# Patient Record
Sex: Male | Born: 1995 | Hispanic: No | Marital: Single | Smoking: Never smoker
Health system: Southern US, Community
[De-identification: ages and names within clinical notes are randomized; demographics above are authoritative.]

## PROBLEM LIST (undated history)

## (undated) DIAGNOSIS — R1110 Vomiting, unspecified: Secondary | ICD-10-CM

## (undated) DIAGNOSIS — J45909 Unspecified asthma, uncomplicated: Secondary | ICD-10-CM

## (undated) DIAGNOSIS — F909 Attention-deficit hyperactivity disorder, unspecified type: Secondary | ICD-10-CM

## (undated) DIAGNOSIS — F32A Depression, unspecified: Secondary | ICD-10-CM

## (undated) HISTORY — DX: Vomiting, unspecified: R11.10

## (undated) HISTORY — PX: ORTHOPEDIC SURGERY: SHX850

## (undated) HISTORY — PX: NO PAST SURGERIES: SHX2092

---
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
## 2017-01-27 ENCOUNTER — Emergency Department
Admission: EM | Admit: 2017-01-27 | Discharge: 2017-01-27 | Disposition: A | Discharge status: Home / Self Care | Payer: Medicaid Other | Attending: Emergency Medicine | Admitting: Emergency Medicine

## 2017-01-27 ENCOUNTER — Encounter: Payer: Self-pay | Admitting: Emergency Medicine

## 2017-01-27 DIAGNOSIS — R252 Cramp and spasm: Secondary | ICD-10-CM | POA: Insufficient documentation

## 2017-01-27 DIAGNOSIS — J45909 Unspecified asthma, uncomplicated: Secondary | ICD-10-CM | POA: Diagnosis not present

## 2017-01-27 DIAGNOSIS — Z79899 Other long term (current) drug therapy: Secondary | ICD-10-CM | POA: Diagnosis not present

## 2017-01-27 DIAGNOSIS — R1110 Vomiting, unspecified: Secondary | ICD-10-CM | POA: Diagnosis present

## 2017-01-27 DIAGNOSIS — E860 Dehydration: Secondary | ICD-10-CM | POA: Diagnosis not present

## 2017-01-27 LAB — URINALYSIS, COMPLETE (UACMP) WITH MICROSCOPIC
BACTERIA UA: NONE SEEN
Glucose, UA: NEGATIVE mg/dL
HGB URINE DIPSTICK: NEGATIVE
Ketones, ur: 5 mg/dL — AB
LEUKOCYTES UA: NEGATIVE
Nitrite: NEGATIVE
Protein, ur: 100 mg/dL — AB
Specific Gravity, Urine: 1.032 — ABNORMAL HIGH (ref 1.005–1.030)
pH: 5 (ref 5.0–8.0)

## 2017-01-27 LAB — CBC
HCT: 48.4 % (ref 40.0–52.0)
Hemoglobin: 17.2 g/dL (ref 13.0–18.0)
MCH: 31.9 pg (ref 26.0–34.0)
MCHC: 35.6 g/dL (ref 32.0–36.0)
MCV: 89.6 fL (ref 80.0–100.0)
Platelets: 228 10*3/uL (ref 150–440)
RBC: 5.41 MIL/uL (ref 4.40–5.90)
RDW: 12.9 % (ref 11.5–14.5)
WBC: 8.5 10*3/uL (ref 3.8–10.6)

## 2017-01-27 LAB — COMPREHENSIVE METABOLIC PANEL
ALBUMIN: 5.5 g/dL — AB (ref 3.5–5.0)
ALT: 25 U/L (ref 17–63)
AST: 36 U/L (ref 15–41)
Alkaline Phosphatase: 71 U/L (ref 38–126)
Anion gap: 11 (ref 5–15)
BUN: 28 mg/dL — AB (ref 6–20)
CHLORIDE: 102 mmol/L (ref 101–111)
CO2: 24 mmol/L (ref 22–32)
Calcium: 10.3 mg/dL (ref 8.9–10.3)
Creatinine, Ser: 1.09 mg/dL (ref 0.61–1.24)
GFR calc Af Amer: >60 mL/min (ref >60)
Glucose, Bld: 92 mg/dL (ref 65–99)
POTASSIUM: 3.8 mmol/L (ref 3.5–5.1)
SODIUM: 137 mmol/L (ref 135–145)
Total Bilirubin: 2.8 mg/dL — ABNORMAL HIGH (ref 0.3–1.2)
Total Protein: 9.2 g/dL — ABNORMAL HIGH (ref 6.5–8.1)

## 2017-01-27 LAB — LIPASE, BLOOD: LIPASE: 23 U/L (ref 11–51)

## 2017-01-27 LAB — CK: Total CK: 417 U/L — ABNORMAL HIGH (ref 49–397)

## 2017-01-27 MED ORDER — ONDANSETRON HCL 4 MG/2ML IJ SOLN
4.0000 mg | Freq: Once | INTRAMUSCULAR | Status: AC
  Administered 2017-01-27: 4 mg via INTRAVENOUS
  Filled 2017-01-27: qty 2

## 2017-01-27 MED ORDER — SODIUM CHLORIDE 0.9 % IV BOLUS (SEPSIS)
1000.0000 mL | Freq: Once | INTRAVENOUS | Status: AC
Start: 2017-01-27 — End: 2017-01-27
  Administered 2017-01-27: 1000 mL via INTRAVENOUS

## 2017-01-27 NOTE — ED Provider Notes (Signed)
[facility] Emergency Department Provider Note  ____________________________________________  Time seen: Approximately 1:04 PM  I have reviewed the triage vital signs and the nursing notes.   HISTORY  Chief Complaint Emesis and body cramp   HPI Jerome Lam is a 21 y.o. male no significant past medical history who presents for evaluation of generalized body cramps. Patient works in [employer] and has been working outside every day in the 90 weather. Yesterday when he came home from work and started having cramps have been intermittent and severe over his body. Had one episode of emesis. This morning the cramps continued and the mother decided to bring him in for evaluation. Patient has no pain or cramps at this time. No vomiting the emergency room. No fever or chills. No myalgias. Patient has no other complaints including no abdominal pain, chest pain, shortness of breath, dizziness. He endorses that his urine has been very dark but has no dysuria.  Past Medical History:  Diagnosis Date  . Asthma   . Attention deficit hyperactivity disorder (ADHD)   . Vomiting    Since December    Patient Active Problem List   Diagnosis Date Noted  . GERD (gastroesophageal reflux disease) 11/08/2012  . Unspecified constipation 10/03/2012  . Abdominal pain, other specified site 10/02/2012  . Vomiting     History reviewed. No pertinent surgical history.  Prior to Admission medications   Medication Sig Start Date End Date Taking? Authorizing Provider  albuterol (PROVENTIL HFA;VENTOLIN HFA) 108 (90 BASE) MCG/ACT inhaler Inhale 2 puffs into the lungs every 6 (six) hours as needed. 30 minutes before soccer for asthma symptoms    [provider]  budesonide-formoterol (SYMBICORT) 160-4.5 MCG/ACT inhaler Inhale 2 puffs into the lungs 2 (two) times daily.    [provider]  fluticasone (CUTIVATE) 0.05 % cream Apply 1 application topically daily as  needed. For eczema    [provider]  fluticasone (FLONASE) 50 MCG/ACT nasal spray Place 1 spray into both nostrils 2 (two) times daily.    [provider]  ibuprofen (ADVIL,MOTRIN) 600 MG tablet Take 1 tablet (600 mg total) by mouth 4 (four) times daily. 10/08/16   Ivery Quale, PA-C  levocetirizine (XYZAL) 5 MG tablet Take 5 mg by mouth every morning.    [provider]  lisdexamfetamine (VYVANSE) 30 MG capsule Take 30 mg by mouth daily.    [provider]  methocarbamol (ROBAXIN) 500 MG tablet Take 1 tablet (500 mg total) by mouth 3 (three) times daily. 10/08/16   Ivery Quale, PA-C  montelukast (SINGULAIR) 10 MG tablet Take 10 mg by mouth every morning.     [provider]  naproxen sodium (ALEVE) 220 MG tablet Take 220-440 mg by mouth daily as needed (for pain).    [provider]  Olopatadine HCl (PATANASE) 0.6 % SOLN Place 1 puff into the nose daily.    [provider]    Allergies Bee venom  Family History  Problem Relation Age of Onset  . Celiac disease Neg Hx   . Ulcers Neg Hx   . Cholelithiasis Neg Hx     Social History Social History  Substance Use Topics  . Smoking status: Never Smoker  . Smokeless tobacco: Never Used  . Alcohol use No    Review of Systems  Constitutional: Negative for fever. + cramps Eyes: Negative for visual changes. ENT: Negative for sore throat. Neck: No neck pain  Cardiovascular: Negative for chest pain. Respiratory: Negative  for shortness of breath. Gastrointestinal: Negative for abdominal pain, diarrhea. + vomiting Genitourinary: Negative for dysuria. Musculoskeletal: Negative for back pain. Skin: Negative for rash. Neurological: Negative for headaches, weakness or numbness. Psych: No SI or HI  ____________________________________________   PHYSICAL EXAM:  VITAL SIGNS: ED Triage Vitals  Enc Vitals Group     BP 01/27/17 1017 (!) 148/81     Pulse Rate 01/27/17 1017  72     Resp 01/27/17 1017 20     Temp 01/27/17 1017 97.7 F (36.5 C)     Temp Source 01/27/17 1017 Oral     SpO2 01/27/17 1017 98 %     Weight 01/27/17 1017 175 lb (79.4 kg)     Height 01/27/17 1017 5\' 6"  (1.676 m)     Head Circumference --      Peak Flow --      Pain Score 01/27/17 1020 6     Pain Loc --      Pain Edu? --      Excl. in GC? --     Constitutional: Alert and oriented. Well appearing and in no apparent distress. HEENT:      Head: Normocephalic and atraumatic.         Eyes: Conjunctivae are normal. Sclera is non-icteric.       Mouth/Throat: Mucous membranes are moist.       Neck: Supple with no signs of meningismus. Cardiovascular: Regular rate and rhythm. No murmurs, gallops, or rubs. 2+ symmetrical distal pulses are present in all extremities. No JVD. Respiratory: Normal respiratory effort. Lungs are clear to auscultation bilaterally. No wheezes, crackles, or rhonchi.  Gastrointestinal: Soft, non tender, and non distended with positive bowel sounds. No rebound or guarding. Genitourinary: No CVA tenderness. Musculoskeletal: Nontender with normal range of motion in all extremities. No edema, cyanosis, or erythema of extremities. Neurologic: Normal speech and language. Face is symmetric. Moving all extremities. No gross focal neurologic deficits are appreciated. Skin: Skin is warm, dry and intact. No rash noted. Psychiatric: Mood and affect are normal. Speech and behavior are normal.  ____________________________________________   LABS (all labs ordered are listed, but only abnormal results are displayed)  Labs Reviewed  COMPREHENSIVE METABOLIC PANEL - Abnormal; Notable for the following:       Result Value   BUN 28 (*)    Total Protein 9.2 (*)    Albumin 5.5 (*)    Total Bilirubin 2.8 (*)    All other components within normal limits  URINALYSIS, COMPLETE (UACMP) WITH MICROSCOPIC - Abnormal; Notable for the following:    Color, Urine AMBER (*)    APPearance  CLOUDY (*)    Specific Gravity, Urine 1.032 (*)    Bilirubin Urine SMALL (*)    Ketones, ur 5 (*)    Protein, ur 100 (*)    Squamous Epithelial / LPF 0-5 (*)    All other components within normal limits  CK - Abnormal; Notable for the following:    Total CK 417 (*)    All other components within normal limits  LIPASE, BLOOD  CBC   ____________________________________________  EKG  none  ____________________________________________  RADIOLOGY  none  ____________________________________________   PROCEDURES  Procedure(s) performed: None Procedures Critical Care performed:  None ____________________________________________   INITIAL IMPRESSION / ASSESSMENT AND PLAN / ED COURSE  21 y.o. 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 Patient is extremely well-appearing, normal vital  signs, no distress, no myalgias, no cramping in the emergency room. Patient received a liter fluid here and reports improvement. UA with 5 ketones consistent with dehydration. Total CK mildly elevated. Normal kidney function. Patient is urinating in the emergency room. Electrolytes with no acute findings. Patient can be discharged home with increase by mouth hydration. Discussed signs and symptoms of dehydration with the patient and recommended that he follows up with his primary care doctor.     Pertinent labs & imaging results that were available during my care of the patient were reviewed by me and considered in my medical decision making (see chart for details).    ____________________________________________   FINAL CLINICAL IMPRESSION(S) / ED DIAGNOSES  Final diagnoses:  Dehydration      NEW MEDICATIONS STARTED DURING THIS VISIT:  New Prescriptions   No medications on file     Note:  This document was prepared using Dragon voice recognition software and may include  unintentional dictation errors.    Don Perking, [place], MD 01/27/17 [phone]

## 2017-01-27 NOTE — ED Notes (Signed)
Pt c/o generalized body cramps x yesterday, denies fever. Pt works outside but states drinks plenty of water. Pt states urine has been dark the last 2 times he urinated. Pt denies any swelling. Pt A&OX4

## 2017-01-27 NOTE — ED Triage Notes (Signed)
Pt c/o generalized body cramping.  Has had vomiting.  Works in [employer] all day outside.  Ambulatory to triage. VSS.  No abdominal pain or diarrhea.  No fevers.

## 2017-02-13 ENCOUNTER — Encounter (HOSPITAL_COMMUNITY): Payer: Self-pay | Admitting: *Deleted

## 2017-02-13 ENCOUNTER — Emergency Department (HOSPITAL_COMMUNITY)
Admission: EM | Admit: 2017-02-13 | Discharge: 2017-02-13 | Disposition: A | Discharge status: Home / Self Care | Payer: Medicaid Other | Attending: Emergency Medicine | Admitting: Emergency Medicine

## 2017-02-13 DIAGNOSIS — Z79899 Other long term (current) drug therapy: Secondary | ICD-10-CM | POA: Insufficient documentation

## 2017-02-13 DIAGNOSIS — Y998 Other external cause status: Secondary | ICD-10-CM | POA: Insufficient documentation

## 2017-02-13 DIAGNOSIS — Y929 Unspecified place or not applicable: Secondary | ICD-10-CM | POA: Diagnosis not present

## 2017-02-13 DIAGNOSIS — J45909 Unspecified asthma, uncomplicated: Secondary | ICD-10-CM | POA: Insufficient documentation

## 2017-02-13 DIAGNOSIS — Y93G1 Activity, food preparation and clean up: Secondary | ICD-10-CM | POA: Diagnosis not present

## 2017-02-13 DIAGNOSIS — W25XXXA Contact with sharp glass, initial encounter: Secondary | ICD-10-CM | POA: Diagnosis not present

## 2017-02-13 DIAGNOSIS — S61214A Laceration without foreign body of right ring finger without damage to nail, initial encounter: Secondary | ICD-10-CM | POA: Insufficient documentation

## 2017-02-13 MED ORDER — LIDOCAINE HCL (PF) 1 % IJ SOLN
5.0000 mL | Freq: Once | INTRAMUSCULAR | Status: DC
  Filled 2017-02-13: qty 5

## 2017-02-13 NOTE — ED Triage Notes (Signed)
Pt was washing a glass and the glass broke cutting his right middle finger. Pt's finger rewrapped in triage.

## 2017-02-13 NOTE — ED Provider Notes (Signed)
[facility]-EMERGENCY DEPT Provider Note   CSN: [id number] Arrival date & time: 02/13/17  1825     History   Chief Complaint Chief Complaint  Patient presents with  . Laceration    HPI Jerome Lam is a 21 y.o. male.  Patient is a 21 year old male who presents to the emergency department with a laceration to the ring finger of the right hand.  The patient states he was washing dishes when he cut his hand on a broken glass. He denies being on any anticoagulation medications. He has no history of any bleeding disorder. He's not had any previous operation or procedure involving the right hand. No other injury reported. The patient states he is up-to-date on his tetanus status.   The history is provided by the patient.  Laceration      Past Medical History:  Diagnosis Date  . Asthma   . Attention deficit hyperactivity disorder (ADHD)   . Vomiting    Since December    Patient Active Problem List   Diagnosis Date Noted  . GERD (gastroesophageal reflux disease) 11/08/2012  . Unspecified constipation 10/03/2012  . Abdominal pain, other specified site 10/02/2012  . Vomiting     History reviewed. No pertinent surgical history.     Home Medications    Prior to Admission medications   Medication Sig Start Date End Date Taking? Authorizing Provider  albuterol (PROVENTIL HFA;VENTOLIN HFA) 108 (90 BASE) MCG/ACT inhaler Inhale 2 puffs into the lungs every 6 (six) hours as needed. 30 minutes before soccer for asthma symptoms    [provider]  budesonide-formoterol (SYMBICORT) 160-4.5 MCG/ACT inhaler Inhale 2 puffs into the lungs 2 (two) times daily.    [provider]  fluticasone (CUTIVATE) 0.05 % cream Apply 1 application topically daily as needed. For eczema    [provider]  fluticasone (FLONASE) 50 MCG/ACT nasal spray Place 1 spray into both nostrils 2 (two) times daily.    [provider]  ibuprofen (ADVIL,MOTRIN) 600 MG tablet Take 1  tablet (600 mg total) by mouth 4 (four) times daily. 10/08/16   Ivery QualeBryant, Anaija Wissink, PA-C  levocetirizine (XYZAL) 5 MG tablet Take 5 mg by mouth every morning.    [provider]  lisdexamfetamine (VYVANSE) 30 MG capsule Take 30 mg by mouth daily.    [provider]  methocarbamol (ROBAXIN) 500 MG tablet Take 1 tablet (500 mg total) by mouth 3 (three) times daily. 10/08/16   Ivery QualeBryant, Milo Schreier, PA-C  montelukast (SINGULAIR) 10 MG tablet Take 10 mg by mouth every morning.     [provider]  naproxen sodium (ALEVE) 220 MG tablet Take 220-440 mg by mouth daily as needed (for pain).    [provider]  Olopatadine HCl (PATANASE) 0.6 % SOLN Place 1 puff into the nose daily.    [provider]    Family History Family History  Problem Relation Age of Onset  . Celiac disease Neg Hx   . Ulcers Neg Hx   . Cholelithiasis Neg Hx     Social History Social History  Substance Use Topics  . Smoking status: Never Smoker  . Smokeless tobacco: Never Used  . Alcohol use No     Allergies   Bee venom   Review of Systems Review of Systems  Constitutional: Negative for activity change.       All ROS Neg except as noted in HPI  HENT: Negative for nosebleeds.   Eyes: Negative for photophobia and discharge.  Respiratory: Negative  for cough, shortness of breath and wheezing.   Cardiovascular: Negative for chest pain and palpitations.  Gastrointestinal: Negative for abdominal pain and blood in stool.  Genitourinary: Negative for dysuria, frequency and hematuria.  Musculoskeletal: Negative for arthralgias, back pain and neck pain.  Skin: Negative.   Neurological: Negative for dizziness, seizures and speech difficulty.  Psychiatric/Behavioral: Negative for confusion and hallucinations.     Physical Exam Updated Vital Signs BP 135/72 (BP Location: Left Arm)   Pulse 74   Temp 98.2 F (36.8 C) (Oral)   Resp 16   Ht 5\' 6"  (1.676 m)   Wt 79.4 kg (175 lb)   SpO2  100%   BMI 28.25 kg/m   Physical Exam  Constitutional: He is oriented to person, place, and time. He appears well-developed and well-nourished.  Non-toxic appearance.  HENT:  Head: Normocephalic.  Right Ear: Tympanic membrane and external ear normal.  Left Ear: Tympanic membrane and external ear normal.  Eyes: Pupils are equal, round, and reactive to light. EOM and lids are normal.  Neck: Normal range of motion. Neck supple. Carotid bruit is not present.  Cardiovascular: Normal rate, regular rhythm, normal heart sounds, intact distal pulses and normal pulses.   Pulmonary/Chest: Breath sounds normal. No respiratory distress.  Abdominal: Soft. Bowel sounds are normal. There is no tenderness. There is no guarding.  Musculoskeletal: Normal range of motion.       Right hand: He exhibits laceration.       Hands: Patient has a 3.2 cm laceration of the right ring finger. There is no tendon involvement. The patient has full range of motion with good flexion and good extension.   Lymphadenopathy:       Head (right side): No submandibular adenopathy present.       Head (left side): No submandibular adenopathy present.    He has no cervical adenopathy.  Neurological: He is alert and oriented to person, place, and time. He has normal strength. No cranial nerve deficit or sensory deficit.  Skin: Skin is warm and dry.  Psychiatric: He has a normal mood and affect. His speech is normal.  Nursing note and vitals reviewed.    ED Treatments / Results  Labs (all labs ordered are listed, but only abnormal results are displayed) Labs Reviewed - No data to display  EKG  EKG Interpretation None       Radiology No results found.  Procedures .Marland KitchenLaceration Repair Date/Time: 02/13/2017 8:59 PM Performed by: Ivery Quale Authorized by: Ivery Quale   Consent:    Consent obtained:  Verbal   Consent given by:  Parent   Risks discussed:  Infection, pain, poor cosmetic result and poor wound  healing Anesthesia (see MAR for exact dosages):    Anesthesia method:  Local infiltration   Local anesthetic:  Lidocaine 1% w/o epi Laceration details:    Location:  Finger   Finger location:  R ring finger   Length (cm):  3.2 Repair type:    Repair type:  Simple Pre-procedure details:    Preparation:  Patient was prepped and draped in usual sterile fashion Exploration:    Hemostasis achieved with:  Direct pressure   Wound exploration: wound explored through full range of motion and entire depth of wound probed and visualized     Wound extent: no nerve damage noted and no tendon damage noted   Treatment:    Area cleansed with:  Betadine   Amount of cleaning:  Standard   Irrigation solution:  Sterile saline  Skin repair:    Repair method:  Sutures   Suture size:  4-0   Suture material:  Nylon   Suture technique:  Simple interrupted   Number of sutures:  8 Approximation:    Approximation:  Close Post-procedure details:    Dressing:  Sterile dressing and splint for protection   Patient tolerance of procedure:  Tolerated well, no immediate complications   (including critical care time)  Medications Ordered in ED Medications  lidocaine (PF) (XYLOCAINE) 1 % injection 5 mL (5 mLs Other Handoff 02/13/17 2012)     Initial Impression / Assessment and Plan / ED Course  I have reviewed the triage vital signs and the nursing notes.  Pertinent labs & imaging results that were available during my care of the patient were reviewed by me and considered in my medical decision making (see chart for details).       Final Clinical Impressions(s) / ED Diagnoses MDM Vital signs within normal limits. The patient states his tetanus status is up-to-date. Laceration to the ring finger of the right hand was repaired with 8 sutures of 4-0 nylon. Patient is asked to keep the wound clean and dry. I've given him a finger splint to prevent pulling the sutures loose. The patient is asked to have the  sutures removed in 7 or 8 days.    Final diagnoses:  Laceration of right ring finger without foreign body without damage to nail, initial encounter    New Prescriptions New Prescriptions   No medications on file     Ivery Quale, Cordelia Poche 02/13/17 2101    Samuel Jester, DO 02/16/17 [phone]

## 2017-02-13 NOTE — Discharge Instructions (Signed)
Please have your sutures removed in 7 or 8 days. Please use the finger splint until the sutures are removed to prevent them from being pulled out. Please keep your hand clean and dry. It is important that this wound remains dry, you may use a cloth with soap and water on it to cleanse the hand and the area around the wound. Please see Dr. Avis Epleyees or return to the emergency department if any signs of infection.

## 2017-02-21 ENCOUNTER — Encounter (HOSPITAL_COMMUNITY): Payer: Self-pay

## 2017-02-21 ENCOUNTER — Emergency Department (HOSPITAL_COMMUNITY)
Admission: EM | Admit: 2017-02-21 | Discharge: 2017-02-21 | Disposition: A | Discharge status: Home / Self Care | Payer: Medicaid Other | Attending: Emergency Medicine | Admitting: Emergency Medicine

## 2017-02-21 DIAGNOSIS — Z4802 Encounter for removal of sutures: Secondary | ICD-10-CM

## 2017-02-21 NOTE — Discharge Instructions (Signed)
Cleanse wound with soap and water daily. Please see your primary physician or return to the emergency department if any unusual redness, red streaking, or pus like drainage.

## 2017-02-21 NOTE — ED Notes (Signed)
Patient given discharge instruction, Band-Aid applied to fingers. verbalized understand. Patient ambulatory out of the department.

## 2017-02-21 NOTE — ED Notes (Signed)
Suture in place of index finer right hand, no drainage, no pain

## 2017-02-21 NOTE — ED Provider Notes (Signed)
[facility]EMERGENCY DEPT Provider Note   CSN: [id number] Arrival date & time: 02/21/17  1209     History   Chief Complaint Chief Complaint  Patient presents with  . Suture / Staple Removal    HPI Jerome Lam is a 21 y.o. male.  The history is provided by the patient.  Suture / Staple Removal  The current episode started more than 1 week ago. The problem has been gradually improving. Pertinent negatives include no chest pain, no abdominal pain, no headaches and no shortness of breath. Associated symptoms comments: No fever or chills. Nothing aggravates the symptoms. He has tried a cold compress for the symptoms. The treatment provided mild relief.    Past Medical History:  Diagnosis Date  . Asthma   . Attention deficit hyperactivity disorder (ADHD)   . Vomiting    Since December    Patient Active Problem List   Diagnosis Date Noted  . GERD (gastroesophageal reflux disease) 11/08/2012  . Unspecified constipation 10/03/2012  . Abdominal pain, other specified site 10/02/2012  . Vomiting     History reviewed. No pertinent surgical history.     Home Medications    Prior to Admission medications   Medication Sig Start Date End Date Taking? Authorizing Provider  albuterol (PROVENTIL HFA;VENTOLIN HFA) 108 (90 BASE) MCG/ACT inhaler Inhale 2 puffs into the lungs every 6 (six) hours as needed. 30 minutes before soccer for asthma symptoms    [provider]  budesonide-formoterol (SYMBICORT) 160-4.5 MCG/ACT inhaler Inhale 2 puffs into the lungs 2 (two) times daily.    [provider]  fluticasone (CUTIVATE) 0.05 % cream Apply 1 application topically daily as needed. For eczema    [provider]  fluticasone (FLONASE) 50 MCG/ACT nasal spray Place 1 spray into both nostrils 2 (two) times daily.    [provider]  ibuprofen (ADVIL,MOTRIN) 600 MG tablet Take 1 tablet (600 mg total) by mouth 4 (four) times daily. 10/08/16   Ivery Quale, PA-C    levocetirizine (XYZAL) 5 MG tablet Take 5 mg by mouth every morning.    [provider]  lisdexamfetamine (VYVANSE) 30 MG capsule Take 30 mg by mouth daily.    [provider]  methocarbamol (ROBAXIN) 500 MG tablet Take 1 tablet (500 mg total) by mouth 3 (three) times daily. 10/08/16   Ivery Quale, PA-C  montelukast (SINGULAIR) 10 MG tablet Take 10 mg by mouth every morning.     [provider]  naproxen sodium (ALEVE) 220 MG tablet Take 220-440 mg by mouth daily as needed (for pain).    [provider]  Olopatadine HCl (PATANASE) 0.6 % SOLN Place 1 puff into the nose daily.    [provider]    Family History Family History  Problem Relation Age of Onset  . Celiac disease Neg Hx   . Ulcers Neg Hx   . Cholelithiasis Neg Hx     Social History Social History  Substance Use Topics  . Smoking status: Never Smoker  . Smokeless tobacco: Never Used  . Alcohol use No     Allergies   Bee venom   Review of Systems Review of Systems  Constitutional: Negative for activity change.       All ROS Neg except as noted in HPI  HENT: Negative for nosebleeds.   Eyes: Negative for photophobia and discharge.  Respiratory: Negative for cough, shortness of breath and wheezing.   Cardiovascular: Negative for chest pain and palpitations.  Gastrointestinal: Negative for  abdominal pain and blood in stool.  Genitourinary: Negative for dysuria, frequency and hematuria.  Musculoskeletal: Negative for arthralgias, back pain and neck pain.  Skin: Negative.   Neurological: Negative for dizziness, seizures, speech difficulty and headaches.  Psychiatric/Behavioral: Negative for confusion and hallucinations.     Physical Exam Updated Vital Signs BP (!) 148/77 (BP Location: Right Arm)   Pulse 78   Temp 98 F (36.7 C) (Oral)   Resp 15   Ht 5\' 6"  (1.676 m)   Wt 79.4 kg (175 lb)   SpO2 99%   BMI 28.25 kg/m   Physical Exam  Constitutional: He is  oriented to person, place, and time. He appears well-developed and well-nourished.  Non-toxic appearance.  HENT:  Head: Normocephalic.  Right Ear: Tympanic membrane and external ear normal.  Left Ear: Tympanic membrane and external ear normal.  Eyes: Pupils are equal, round, and reactive to light. EOM and lids are normal.  Neck: Normal range of motion. Neck supple. Carotid bruit is not present.  Cardiovascular: Normal rate, regular rhythm, normal heart sounds, intact distal pulses and normal pulses.   Pulmonary/Chest: Breath sounds normal. No respiratory distress.  Abdominal: Soft. Bowel sounds are normal. There is no tenderness. There is no guarding.  Musculoskeletal: Normal range of motion.  Sutured wound to the right right index finger is progressing nicely. No swelling. No red streaks appreciated. No drainage appreciated.  Capillary refill is less than 2 seconds. There is some stiffness of the PIP joint, but there is good range of motion present. Sutures remain in place.  Lymphadenopathy:       Head (right side): No submandibular adenopathy present.       Head (left side): No submandibular adenopathy present.    He has no cervical adenopathy.  Neurological: He is alert and oriented to person, place, and time. He has normal strength. No cranial nerve deficit or sensory deficit.  Skin: Skin is warm and dry.  Psychiatric: He has a normal mood and affect. His speech is normal.  Nursing note and vitals reviewed.    ED Treatments / Results  Labs (all labs ordered are listed, but only abnormal results are displayed) Labs Reviewed - No data to display  EKG  EKG Interpretation None       Radiology No results found.  Procedures .Suture Removal Date/Time: 02/21/2017 1:32 PM Performed by: Ivery Quale Authorized by: Ivery Quale   Consent:    Consent obtained:  Verbal   Consent given by:  Patient   Risks discussed:  Bleeding, pain and wound separation Location:     Location:  Upper extremity   Upper extremity location:  Hand   Hand location:  R ring finger Procedure details:    Wound appearance:  No signs of infection, good wound healing and clean   Number of sutures removed:  8 Post-procedure details:    Post-removal:  Band-Aid applied   Patient tolerance of procedure:  Tolerated well, no immediate complications   (including critical care time)  Medications Ordered in ED Medications - No data to display   Initial Impression / Assessment and Plan / ED Course  I have reviewed the triage vital signs and the nursing notes.  Pertinent labs & imaging results that were available during my care of the patient were reviewed by me and considered in my medical decision making (see chart for details).       Final Clinical Impressions(s) / ED Diagnoses MDM Vital signs within normal limits. No evidence of  advancing infection at the sutured site. Sutures removed by me. After the sutures were removed, the wound site looks good. No evidence of infection.  Feel that it is safe for the patient to be discharged home. Patient given instructions to return if any signs of infection return. Patient is in agreement with this plan.    Final diagnoses:  Visit for suture removal    New Prescriptions New Prescriptions   No medications on file     Duayne CalBryant, Cosandra Plouffe, PA-C 02/21/17 1333    Bethann BerkshireZammit, Joseph, MD 02/22/17 1531

## 2017-02-21 NOTE — ED Triage Notes (Signed)
Here for suture removal of right 4th finger. Denies any complaints.

## 2017-11-24 ENCOUNTER — Encounter (HOSPITAL_COMMUNITY): Payer: Self-pay

## 2017-11-24 ENCOUNTER — Emergency Department (HOSPITAL_COMMUNITY)
Admission: EM | Admit: 2017-11-24 | Discharge: 2017-11-24 | Disposition: A | Discharge status: Home / Self Care | Payer: Medicaid Other | Attending: Emergency Medicine | Admitting: Emergency Medicine

## 2017-11-24 ENCOUNTER — Other Ambulatory Visit: Payer: Self-pay

## 2017-11-24 DIAGNOSIS — R112 Nausea with vomiting, unspecified: Secondary | ICD-10-CM | POA: Diagnosis present

## 2017-11-24 DIAGNOSIS — Z79899 Other long term (current) drug therapy: Secondary | ICD-10-CM | POA: Diagnosis not present

## 2017-11-24 DIAGNOSIS — J45909 Unspecified asthma, uncomplicated: Secondary | ICD-10-CM | POA: Insufficient documentation

## 2017-11-24 LAB — COMPREHENSIVE METABOLIC PANEL
ALT: 22 U/L (ref 17–63)
ANION GAP: 15 (ref 5–15)
AST: 29 U/L (ref 15–41)
Albumin: 5.3 g/dL — ABNORMAL HIGH (ref 3.5–5.0)
Alkaline Phosphatase: 70 U/L (ref 38–126)
BUN: 29 mg/dL — AB (ref 6–20)
CHLORIDE: 99 mmol/L — AB (ref 101–111)
CO2: 23 mmol/L (ref 22–32)
Calcium: 10.1 mg/dL (ref 8.9–10.3)
Creatinine, Ser: 0.97 mg/dL (ref 0.61–1.24)
GFR calc Af Amer: >60 mL/min (ref >60)
GFR calc non Af Amer: >60 mL/min (ref >60)
Glucose, Bld: 92 mg/dL (ref 65–99)
Potassium: 3.6 mmol/L (ref 3.5–5.1)
Sodium: 137 mmol/L (ref 135–145)
TOTAL PROTEIN: 9 g/dL — AB (ref 6.5–8.1)
Total Bilirubin: 2.3 mg/dL — ABNORMAL HIGH (ref 0.3–1.2)

## 2017-11-24 LAB — URINALYSIS, ROUTINE W REFLEX MICROSCOPIC
BILIRUBIN URINE: NEGATIVE
Glucose, UA: NEGATIVE mg/dL
Hgb urine dipstick: NEGATIVE
Ketones, ur: 5 mg/dL — AB
Leukocytes, UA: NEGATIVE
Nitrite: NEGATIVE
PH: 5 (ref 5.0–8.0)
Protein, ur: NEGATIVE mg/dL
Specific Gravity, Urine: 1.023 (ref 1.005–1.030)

## 2017-11-24 LAB — CBC WITH DIFFERENTIAL/PLATELET
BASOS ABS: 0 10*3/uL (ref 0.0–0.1)
BASOS PCT: 0 %
Eosinophils Absolute: 0.1 10*3/uL (ref 0.0–0.7)
Eosinophils Relative: 1 %
HEMATOCRIT: 48.8 % (ref 39.0–52.0)
Hemoglobin: 17.7 g/dL — ABNORMAL HIGH (ref 13.0–17.0)
Lymphocytes Relative: 25 %
Lymphs Abs: 2.3 10*3/uL (ref 0.7–4.0)
MCH: 32.5 pg (ref 26.0–34.0)
MCHC: 36.3 g/dL — ABNORMAL HIGH (ref 30.0–36.0)
MCV: 89.7 fL (ref 78.0–100.0)
MONO ABS: 0.8 10*3/uL (ref 0.1–1.0)
Monocytes Relative: 9 %
NEUTROS PCT: 65 %
Neutro Abs: 5.7 10*3/uL (ref 1.7–7.7)
Platelets: 225 10*3/uL (ref 150–400)
RBC: 5.44 MIL/uL (ref 4.22–5.81)
RDW: 12.2 % (ref 11.5–15.5)
WBC: 8.9 10*3/uL (ref 4.0–10.5)

## 2017-11-24 LAB — CK: Total CK: 305 U/L (ref 49–397)

## 2017-11-24 LAB — LIPASE, BLOOD: Lipase: 24 U/L (ref 11–51)

## 2017-11-24 MED ORDER — FAMOTIDINE 20 MG PO TABS: 20.0000 mg | ORAL_TABLET | Freq: Two times a day (BID) | ORAL | 0 refills | Status: DC

## 2017-11-24 MED ORDER — ONDANSETRON HCL 4 MG/2ML IJ SOLN
4.0000 mg | Freq: Once | INTRAMUSCULAR | Status: AC
  Administered 2017-11-24: 4 mg via INTRAVENOUS
  Filled 2017-11-24: qty 2

## 2017-11-24 MED ORDER — SODIUM CHLORIDE 0.9 % IV BOLUS
1000.0000 mL | Freq: Once | INTRAVENOUS | Status: AC
  Administered 2017-11-24: 1000 mL via INTRAVENOUS

## 2017-11-24 MED ORDER — ONDANSETRON 4 MG PO TBDP: 4.0000 mg | ORAL_TABLET | Freq: Three times a day (TID) | ORAL | 0 refills | Status: DC | PRN

## 2017-11-24 NOTE — ED Provider Notes (Signed)
[facility] EMERGENCY DEPARTMENT Provider Note   CSN: [id number] Arrival date & time: 11/24/17  4098     History   Chief Complaint Chief Complaint  Patient presents with  . Emesis    HPI Jerome Lam is a 22 y.o. male with occasional episodes of unexplained vomiting presenting with nausea, vomiting and associated intermittent upper abdominal cramping which started around 6 PM yesterday.  He had finished his work shift where he works in [employer] when his symptoms began.  He has been unable to maintain any p.o intake since that time.  He denies fevers or chills, diarrhea, back or flank pain.  He is currently symptom-free, his last episode of emesis occurred prior to arrival.  He reports drinking plenty of fluids yesterday while at work, although recalls that his last urine output around 11 AM yesterday.  He denies any history of kidney problems.  He has had no medications prior to arrival.  No alleviators for symptoms.  The last episode of this occurred in December.  The history is provided by the patient.    Past Medical History:  Diagnosis Date  . Asthma   . Attention deficit hyperactivity disorder (ADHD)   . Vomiting    Since December    Patient Active Problem List   Diagnosis Date Noted  . GERD (gastroesophageal reflux disease) 11/08/2012  . Unspecified constipation 10/03/2012  . Abdominal pain, other specified site 10/02/2012  . Vomiting     History reviewed. No pertinent surgical history.      Home Medications    Prior to Admission medications   Medication Sig Start Date End Date Taking? Authorizing Provider  albuterol (PROVENTIL HFA;VENTOLIN HFA) 108 (90 BASE) MCG/ACT inhaler Inhale 2 puffs into the lungs every 6 (six) hours as needed. 30 minutes before soccer for asthma symptoms    [provider]  budesonide-formoterol (SYMBICORT) 160-4.5 MCG/ACT inhaler Inhale 2 puffs into the lungs 2 (two) times daily.    [provider]  fluticasone  (CUTIVATE) 0.05 % cream Apply 1 application topically daily as needed. For eczema    [provider]  fluticasone (FLONASE) 50 MCG/ACT nasal spray Place 1 spray into both nostrils 2 (two) times daily.    [provider]  ibuprofen (ADVIL,MOTRIN) 600 MG tablet Take 1 tablet (600 mg total) by mouth 4 (four) times daily. 10/08/16   Ivery Quale, PA-C  levocetirizine (XYZAL) 5 MG tablet Take 5 mg by mouth every morning.    [provider]  lisdexamfetamine (VYVANSE) 30 MG capsule Take 30 mg by mouth daily.    [provider]  methocarbamol (ROBAXIN) 500 MG tablet Take 1 tablet (500 mg total) by mouth 3 (three) times daily. 10/08/16   Ivery Quale, PA-C  montelukast (SINGULAIR) 10 MG tablet Take 10 mg by mouth every morning.     [provider]  naproxen sodium (ALEVE) 220 MG tablet Take 220-440 mg by mouth daily as needed (for pain).    [provider]  Olopatadine HCl (PATANASE) 0.6 % SOLN Place 1 puff into the nose daily.    [provider]  ondansetron (ZOFRAN ODT) 4 MG disintegrating tablet Take 1 tablet (4 mg total) by mouth every 8 (eight) hours as needed for nausea or vomiting. 11/24/17   Ameliya Nicotra, Raynelle Fanning, PA-C  esomeprazole (NEXIUM) 40 MG capsule Take 1 capsule (40 mg total) by mouth daily before breakfast. Patient not taking: Reported on 08/04/2014 11/08/12 08/04/14  Jon Gills, MD    Family History  Family History  Problem Relation Age of Onset  . Celiac disease Neg Hx   . Ulcers Neg Hx   . Cholelithiasis Neg Hx     Social History Social History   Tobacco Use  . Smoking status: Never Smoker  . Smokeless tobacco: Never Used  Substance Use Topics  . Alcohol use: No  . Drug use: No     Allergies   Bee venom   Review of Systems Review of Systems  Constitutional: Negative for fever.  HENT: Negative for congestion and sore throat.   Eyes: Negative.   Respiratory: Negative for chest tightness and shortness of breath.     Cardiovascular: Negative for chest pain.  Gastrointestinal: Positive for abdominal pain, nausea and vomiting. Negative for abdominal distention, constipation and diarrhea.  Genitourinary: Negative.   Musculoskeletal: Negative for arthralgias, joint swelling and neck pain.  Skin: Negative.  Negative for rash and wound.  Neurological: Negative for dizziness, weakness, light-headedness, numbness and headaches.  Psychiatric/Behavioral: Negative.      Physical Exam Updated Vital Signs BP 137/82 (BP Location: Left Arm)   Pulse 94   Temp 97.7 F (36.5 C) (Oral)   Resp 18   Ht 5\' 6"  (1.676 m)   Wt 79.4 kg (175 lb)   SpO2 100%   BMI 28.25 kg/m   Physical Exam  Constitutional: He appears well-developed and well-nourished.  HENT:  Head: Normocephalic and atraumatic.  Eyes: Conjunctivae are normal.  Cardiovascular: Normal rate, regular rhythm, normal heart sounds and intact distal pulses.  Pulmonary/Chest: Effort normal and breath sounds normal. He has no wheezes.  Abdominal: Soft. Bowel sounds are normal. He exhibits no distension and no mass. There is no tenderness. There is no rebound and no guarding.  Completely benign abd on initial exam.  Musculoskeletal: Normal range of motion.  Neurological: He is alert.  Skin: Skin is warm and dry.  Psychiatric: He has a normal mood and affect.  Nursing note and vitals reviewed.    ED Treatments / Results  Labs (all labs ordered are listed, but only abnormal results are displayed) Labs Reviewed  CBC WITH DIFFERENTIAL/PLATELET - Abnormal; Notable for the following components:      Result Value   Hemoglobin 17.7 (*)    MCHC 36.3 (*)    All other components within normal limits  COMPREHENSIVE METABOLIC PANEL - Abnormal; Notable for the following components:   Chloride 99 (*)    BUN 29 (*)    Total Protein 9.0 (*)    Albumin 5.3 (*)    Total Bilirubin 2.3 (*)    All other components within normal limits  URINALYSIS, ROUTINE W REFLEX  MICROSCOPIC - Abnormal; Notable for the following components:   Ketones, ur 5 (*)    All other components within normal limits  LIPASE, BLOOD  CK    EKG None  Radiology No results found.  Procedures Procedures (including critical care time)  Medications Ordered in ED Medications  sodium chloride 0.9 % bolus 1,000 mL (1,000 mLs Intravenous New Bag/Given 11/24/17 0946)  ondansetron (ZOFRAN) injection 4 mg (4 mg Intravenous Given 11/24/17 0931)  sodium chloride 0.9 % bolus 1,000 mL (0 mLs Intravenous Stopped 11/24/17 0945)     Initial Impression / Assessment and Plan / ED Course  I have reviewed the triage vital signs and the nursing notes.  Pertinent labs & imaging results that were available during my care of the patient were reviewed by me and considered in my medical decision making (see chart  for details).     Labs reviewed with elevation of BUN, creatinine, urine spec grav ok, total CK ok, no signs of rhabdo or severe dehydration. Lft's and lipase normal, doubt biliary source of sx.  He does endorse h/o gerd but with no recent sx.  Will cover with pepcid.  He was given IV fluids here.  No n/v while here and tolerated PO intake.  Repeat exam, no abdominal tenderness. Normal abd exam. Zofran, PO as tolerated. F/u pcp prn.  Final Clinical Impressions(s) / ED Diagnoses   Final diagnoses:  Non-intractable vomiting with nausea, unspecified vomiting type    ED Discharge Orders        Ordered    ondansetron (ZOFRAN ODT) 4 MG disintegrating tablet  Every 8 hours PRN     11/24/17 1017       Burgess Amor, PA-C 11/24/17 1022    Loren Racer, MD 11/24/17 1456

## 2017-11-24 NOTE — ED Notes (Signed)
LBM 2 days ago.

## 2017-11-24 NOTE — ED Triage Notes (Signed)
Pt reports vomiting since yesterday and cramping in legs.

## 2017-11-24 NOTE — ED Notes (Signed)
Water given  

## 2018-05-24 ENCOUNTER — Encounter (HOSPITAL_COMMUNITY): Payer: Self-pay

## 2018-05-24 ENCOUNTER — Emergency Department (HOSPITAL_COMMUNITY)
Admission: EM | Admit: 2018-05-24 | Discharge: 2018-05-24 | Disposition: A | Discharge status: Home / Self Care | Payer: Medicaid Other | Attending: Emergency Medicine | Admitting: Emergency Medicine

## 2018-05-24 ENCOUNTER — Other Ambulatory Visit: Payer: Self-pay

## 2018-05-24 DIAGNOSIS — F909 Attention-deficit hyperactivity disorder, unspecified type: Secondary | ICD-10-CM | POA: Insufficient documentation

## 2018-05-24 DIAGNOSIS — J45909 Unspecified asthma, uncomplicated: Secondary | ICD-10-CM | POA: Insufficient documentation

## 2018-05-24 DIAGNOSIS — F32A Depression, unspecified: Secondary | ICD-10-CM

## 2018-05-24 DIAGNOSIS — Z79899 Other long term (current) drug therapy: Secondary | ICD-10-CM | POA: Insufficient documentation

## 2018-05-24 DIAGNOSIS — F329 Major depressive disorder, single episode, unspecified: Secondary | ICD-10-CM | POA: Insufficient documentation

## 2018-05-24 NOTE — ED Notes (Signed)
Provider in room  

## 2018-05-24 NOTE — Discharge Instructions (Signed)
Return to ED if you start to develop thoughts of wanting to hurt yourself or others, having hallucinations, chest pain, trouble breathing trouble swallowing.

## 2018-05-24 NOTE — ED Provider Notes (Signed)
[facility] EMERGENCY DEPARTMENT Provider Note   CSN: [id number] Arrival date & time: 05/24/18  1120     History   Chief Complaint Chief Complaint  Patient presents with  . Depression    HPI Jerome Lam is a 22 y.o. male with a past medical history of ADHD who presents to ED for evaluation of worsening depression over the past 2 months.  States that his girlfriend of 3 years moved away to [place] and he has been more depressed ever since.  States that "she was my whole life, I do not really know what to do without her."  Patient has been on an antidepressant which she does not know the name of for the past 3 weeks.  He is unsure if this has been working.  He is scheduled to meet with a therapist next week.  I asked the patient 3 times and he denied any suicidal or homicidal ideations, auditory visual hallucinations.  Denies any prior suicide attempts.  HPI  Past Medical History:  Diagnosis Date  . Asthma   . Attention deficit hyperactivity disorder (ADHD)   . Vomiting    Since December    Patient Active Problem List   Diagnosis Date Noted  . GERD (gastroesophageal reflux disease) 11/08/2012  . Unspecified constipation 10/03/2012  . Abdominal pain, other specified site 10/02/2012  . Vomiting     History reviewed. No pertinent surgical history.      Home Medications    Prior to Admission medications   Medication Sig Start Date End Date Taking? Authorizing Provider  albuterol (PROVENTIL HFA;VENTOLIN HFA) 108 (90 BASE) MCG/ACT inhaler Inhale 2 puffs into the lungs every 6 (six) hours as needed. 30 minutes before soccer for asthma symptoms    [provider]  budesonide-formoterol (SYMBICORT) 160-4.5 MCG/ACT inhaler Inhale 2 puffs into the lungs 2 (two) times daily.    [provider]  famotidine (PEPCID) 20 MG tablet Take 1 tablet (20 mg total) by mouth 2 (two) times daily. 11/24/17   Burgess Amor, PA-C  fluticasone (CUTIVATE) 0.05 %  cream Apply 1 application topically daily as needed. For eczema    [provider]  fluticasone (FLONASE) 50 MCG/ACT nasal spray Place 1 spray into both nostrils 2 (two) times daily.    [provider]  ibuprofen (ADVIL,MOTRIN) 600 MG tablet Take 1 tablet (600 mg total) by mouth 4 (four) times daily. 10/08/16   Ivery Quale, PA-C  levocetirizine (XYZAL) 5 MG tablet Take 5 mg by mouth every morning.    [provider]  lisdexamfetamine (VYVANSE) 30 MG capsule Take 30 mg by mouth daily.    [provider]  methocarbamol (ROBAXIN) 500 MG tablet Take 1 tablet (500 mg total) by mouth 3 (three) times daily. 10/08/16   Ivery Quale, PA-C  montelukast (SINGULAIR) 10 MG tablet Take 10 mg by mouth every morning.     [provider]  naproxen sodium (ALEVE) 220 MG tablet Take 220-440 mg by mouth daily as needed (for pain).    [provider]  Olopatadine HCl (PATANASE) 0.6 % SOLN Place 1 puff into the nose daily.    [provider]  ondansetron (ZOFRAN ODT) 4 MG disintegrating tablet Take 1 tablet (4 mg total) by mouth every 8 (eight) hours as needed for nausea or vomiting. 11/24/17   Burgess Amor, PA-C    Family History Family History  Problem Relation Age of Onset  . Celiac disease Neg Hx   . Ulcers  Neg Hx   . Cholelithiasis Neg Hx     Social History Social History   Tobacco Use  . Smoking status: Never Smoker  . Smokeless tobacco: Never Used  Substance Use Topics  . Alcohol use: No  . Drug use: Yes    Types: Marijuana     Allergies   Bee venom   Review of Systems Review of Systems  Constitutional: Negative for chills and fever.  Gastrointestinal: Negative for vomiting.  Psychiatric/Behavioral: Positive for dysphoric mood. Negative for agitation, confusion, decreased concentration and suicidal ideas.     Physical Exam Updated Vital Signs BP (!) 143/83 (BP Location: Right Arm)   Pulse 84   Temp 97.7 F (36.5 C)  (Oral)   Resp 20   Ht 5\' 6"  (1.676 m)   Wt 79.4 kg   SpO2 98%   BMI 28.25 kg/m   Physical Exam  Constitutional: He appears well-developed and well-nourished. No distress.  HENT:  Head: Normocephalic and atraumatic.  Eyes: Conjunctivae and EOM are normal. No scleral icterus.  Neck: Normal range of motion.  Pulmonary/Chest: Effort normal. No respiratory distress.  Neurological: He is alert.  Skin: No rash noted. He is not diaphoretic.  Psychiatric: He is withdrawn. He exhibits a depressed mood. He expresses no homicidal and no suicidal ideation. He expresses no suicidal plans and no homicidal plans.  Nursing note and vitals reviewed.    ED Treatments / Results  Labs (all labs ordered are listed, but only abnormal results are displayed) Labs Reviewed - No data to display  EKG None  Radiology No results found.  Procedures Procedures (including critical care time)  Medications Ordered in ED Medications - No data to display   Initial Impression / Assessment and Plan / ED Course  I have reviewed the triage vital signs and the nursing notes.  Pertinent labs & imaging results that were available during my care of the patient were reviewed by me and considered in my medical decision making (see chart for details).     22 year old male presents to ED for worsening depression.  He was actually seen by his pediatrician this morning and was sent to the ED.  He is unsure why he is here.  He has been taking his antidepressant has prescribed, denies any suicidal ideations or thoughts and is scheduled to meet with a therapist next week.  His depression has worsened with the recent moving away of his girlfriend of 3 years.  Patient adamant on denying SI, HI, AVH.  I did offer TTS and other resources for him but he declines.  States that he will continue to take his medications, see his therapist and but will return to the ED if he begins to experience any thoughts of wanting to harm  himself or others.  Patient is agreeable to this plan.  Portions of this note were generated with Scientist, clinical (histocompatibility and immunogenetics). Dictation errors may occur despite best attempts at proofreading.   Final Clinical Impressions(s) / ED Diagnoses   Final diagnoses:  Depression, unspecified depression type    ED Discharge Orders    None       Dietrich Pates, PA-C 05/24/18 1156    Mancel Bale, MD 05/24/18 1755

## 2018-05-24 NOTE — ED Triage Notes (Signed)
Pt denies feeling suicidal, states "I am just tired from all the heartache and sometimes it breaks me down". Pt reports going through a recent breakup with long term girlfriend moving to [place].

## 2020-03-02 ENCOUNTER — Emergency Department (HOSPITAL_COMMUNITY)
Admission: EM | Admit: 2020-03-02 | Discharge: 2020-03-02 | Disposition: A | Discharge status: Home / Self Care | Payer: Medicaid Other | Attending: Emergency Medicine | Admitting: Emergency Medicine

## 2020-03-02 ENCOUNTER — Other Ambulatory Visit: Payer: Self-pay

## 2020-03-02 ENCOUNTER — Encounter (HOSPITAL_COMMUNITY): Payer: Self-pay | Admitting: Emergency Medicine

## 2020-03-02 DIAGNOSIS — M79632 Pain in left forearm: Secondary | ICD-10-CM | POA: Diagnosis present

## 2020-03-02 DIAGNOSIS — F909 Attention-deficit hyperactivity disorder, unspecified type: Secondary | ICD-10-CM | POA: Insufficient documentation

## 2020-03-02 DIAGNOSIS — F1210 Cannabis abuse, uncomplicated: Secondary | ICD-10-CM | POA: Diagnosis not present

## 2020-03-02 DIAGNOSIS — Z79899 Other long term (current) drug therapy: Secondary | ICD-10-CM | POA: Diagnosis not present

## 2020-03-02 DIAGNOSIS — Z23 Encounter for immunization: Secondary | ICD-10-CM | POA: Insufficient documentation

## 2020-03-02 DIAGNOSIS — L02416 Cutaneous abscess of left lower limb: Secondary | ICD-10-CM | POA: Insufficient documentation

## 2020-03-02 DIAGNOSIS — J45909 Unspecified asthma, uncomplicated: Secondary | ICD-10-CM | POA: Diagnosis not present

## 2020-03-02 DIAGNOSIS — L03119 Cellulitis of unspecified part of limb: Secondary | ICD-10-CM

## 2020-03-02 DIAGNOSIS — L02414 Cutaneous abscess of left upper limb: Secondary | ICD-10-CM

## 2020-03-02 MED ORDER — CEPHALEXIN 500 MG PO CAPS
1000.0000 mg | ORAL_CAPSULE | Freq: Once | ORAL | Status: AC
  Administered 2020-03-02: 1000 mg via ORAL
  Filled 2020-03-02: qty 2

## 2020-03-02 MED ORDER — CEPHALEXIN 500 MG PO CAPS: 1000.0000 mg | ORAL_CAPSULE | Freq: Two times a day (BID) | ORAL | 0 refills | Status: DC

## 2020-03-02 MED ORDER — LIDOCAINE-EPINEPHRINE (PF) 2 %-1:200000 IJ SOLN
20.0000 mL | Freq: Once | INTRAMUSCULAR | Status: AC
  Administered 2020-03-02: 20 mL
  Filled 2020-03-02: qty 20

## 2020-03-02 MED ORDER — TETANUS-DIPHTH-ACELL PERTUSSIS 5-2.5-18.5 LF-MCG/0.5 IM SUSP
0.5000 mL | Freq: Once | INTRAMUSCULAR | Status: AC
  Administered 2020-03-02: 0.5 mL via INTRAMUSCULAR
  Filled 2020-03-02: qty 0.5

## 2020-03-02 NOTE — ED Triage Notes (Signed)
Patient states he was bitten by an insect on the left arm and now it appears to be getting infected.

## 2020-03-02 NOTE — Discharge Instructions (Signed)
It was our pleasure to provide your ER care today - we hope that you feel better.  Keep area very clean.   Change dressing 1-2x/day.   Take antibiotic as prescribed.   Return to ER if worse, new symptoms, fevers, spreading redness, severe pain, or other concern.

## 2020-03-02 NOTE — ED Provider Notes (Signed)
[facility] EMERGENCY DEPARTMENT Provider Note   CSN: [id number] Arrival date & time: 03/02/20  1020     History Chief Complaint  Patient presents with  . Arm Pain    Jerome Lam is a 24 y.o. male.  Patient c/o area redness, pustule, soreness to volar aspect left forearm. States symptoms acute onset a couple days ago after being bitten by 'sweat bugs'. Symptoms acute onset, moderate, persistent, w mild soreness/pain.  States he had been rubbing/scratching area, and now two pustules to forearm, with surrounding erythema. Denies other body rash or lesions. No hx mrsa/abscesses. Denies fever or chills. States otherwise does feel sick or ill. No nv. No weakness/faintness.   The history is provided by the patient.  Arm Pain Pertinent negatives include no chest pain and no shortness of breath.       Past Medical History:  Diagnosis Date  . Asthma   . Attention deficit hyperactivity disorder (ADHD)   . Vomiting    Since December    Patient Active Problem List   Diagnosis Date Noted  . GERD (gastroesophageal reflux disease) 11/08/2012  . Unspecified constipation 10/03/2012  . Abdominal pain, other specified site 10/02/2012  . Vomiting     History reviewed. No pertinent surgical history.     Family History  Problem Relation Age of Onset  . Celiac disease Neg Hx   . Ulcers Neg Hx   . Cholelithiasis Neg Hx     Social History   Tobacco Use  . Smoking status: Never Smoker  . Smokeless tobacco: Never Used  Vaping Use  . Vaping Use: Never used  Substance Use Topics  . Alcohol use: No  . Drug use: Yes    Frequency: 2.0 times per week    Types: Marijuana    Home Medications Prior to Admission medications   Medication Sig Start Date End Date Taking? Authorizing Provider  albuterol (PROVENTIL HFA;VENTOLIN HFA) 108 (90 BASE) MCG/ACT inhaler Inhale 2 puffs into the lungs every 6 (six) hours as needed. 30 minutes before soccer for asthma symptoms    [provider]  budesonide-formoterol (SYMBICORT) 160-4.5 MCG/ACT inhaler Inhale 2 puffs into the lungs 2 (two) times daily.    [provider]  famotidine (PEPCID) 20 MG tablet Take 1 tablet (20 mg total) by mouth 2 (two) times daily. 11/24/17   Burgess Amor, PA-C  fluticasone (CUTIVATE) 0.05 % cream Apply 1 application topically daily as needed. For eczema    [provider]  fluticasone (FLONASE) 50 MCG/ACT nasal spray Place 1 spray into both nostrils 2 (two) times daily.    [provider]  ibuprofen (ADVIL,MOTRIN) 600 MG tablet Take 1 tablet (600 mg total) by mouth 4 (four) times daily. 10/08/16   Ivery Quale, PA-C  levocetirizine (XYZAL) 5 MG tablet Take 5 mg by mouth every morning.    [provider]  lisdexamfetamine (VYVANSE) 30 MG capsule Take 30 mg by mouth daily.    [provider]  methocarbamol (ROBAXIN) 500 MG tablet Take 1 tablet (500 mg total) by mouth 3 (three) times daily. 10/08/16   Ivery Quale, PA-C  montelukast (SINGULAIR) 10 MG tablet Take 10 mg by mouth every morning.     [provider]  naproxen sodium (ALEVE) 220 MG tablet Take 220-440 mg by mouth daily as needed (for pain).    [provider]  Olopatadine HCl (PATANASE) 0.6 % SOLN Place 1 puff into the nose daily.    [provider]  ondansetron (ZOFRAN ODT) 4 MG disintegrating tablet Take 1 tablet (4 mg total) by mouth every 8 (eight) hours as needed for nausea or vomiting. 11/24/17   Idol, Raynelle Fanning, PA-C    Allergies    Bee venom  Review of Systems   Review of Systems  Constitutional: Negative for fever.  HENT: Negative for sore throat.   Eyes: Negative for redness.  Respiratory: Negative for shortness of breath.   Cardiovascular: Negative for chest pain.  Gastrointestinal: Negative for vomiting.  Genitourinary: Negative for flank pain.  Musculoskeletal: Negative for neck pain.  Skin: Negative for rash.  Neurological: Negative for numbness.    Hematological: Does not bruise/bleed easily.  Psychiatric/Behavioral: Negative for confusion.    Physical Exam Updated Vital Signs BP (!) 112/90 (BP Location: Right Arm)   Pulse 83   Temp 98 F (36.7 C) (Oral)   Resp 18   Ht 1.676 m (5\' 6" )   Wt 73 kg   SpO2 100%   BMI 25.99 kg/m   Physical Exam Vitals and nursing note reviewed.  Constitutional:      Appearance: Normal appearance. He is well-developed.  HENT:     Head: Atraumatic.     Nose: Nose normal.     Mouth/Throat:     Mouth: Mucous membranes are moist.  Eyes:     General: No scleral icterus.    Conjunctiva/sclera: Conjunctivae normal.  Neck:     Trachea: No tracheal deviation.  Cardiovascular:     Rate and Rhythm: Normal rate.     Pulses: Normal pulses.  Pulmonary:     Effort: Pulmonary effort is normal. No accessory muscle usage or respiratory distress.  Abdominal:     General: There is no distension.  Genitourinary:    Comments: No cva tenderness. Musculoskeletal:     Cervical back: Neck supple.     Comments: Two small pustules to volar aspect left forearm. Larger one with fluctuance, and mild induration, with surrounding erythema 4-5 cm. No crepitus. Compartments of arm soft, not tense, no significant sts. Radial pulse 2+. No LUE l/a.   Skin:    General: Skin is warm and dry.     Findings: No rash.  Neurological:     Mental Status: He is alert.     Comments: Alert, speech clear.   Psychiatric:        Mood and Affect: Mood normal.     ED Results / Procedures / Treatments   Labs (all labs ordered are listed, but only abnormal results are displayed) Labs Reviewed - No data to display  EKG None  Radiology No results found.  Procedures . Incision and Drainage  Date/Time: 03/02/2020 12:48 PM Performed by: 05/02/2020, MD Authorized by: Cathren Laine, MD   Consent:    Consent given by:  Patient Location:    Type:  Abscess   Location:  Upper extremity   Upper extremity location:  Arm    Arm location:  L lower arm Pre-procedure details:    Skin preparation:  Betadine Anesthesia (see MAR for exact dosages):    Anesthesia method:  Local infiltration   Local anesthetic:  Lidocaine 2% WITH epi Procedure type:    Complexity:  Complex Procedure details:    Incision types:  Single straight   Incision depth:  Subcutaneous   Scalpel blade:  11   Wound management:  Probed and deloculated   Drainage:  Purulent   Drainage amount: small amt.   Wound treatment:  Wound left open Post-procedure details:  Patient tolerance of procedure:  Tolerated well, no immediate complications   (including critical care time)  Medications Ordered in ED Medications  lidocaine-EPINEPHrine (XYLOCAINE W/EPI) 2 %-1:200000 (PF) injection 20 mL (20 mLs Infiltration Given 03/02/20 1221)  cephALEXin (KEFLEX) capsule 1,000 mg (1,000 mg Oral Given 03/02/20 1221)    ED Course  I have reviewed the triage vital signs and the nursing notes.  Pertinent labs & imaging results that were available during my care of the patient were reviewed by me and considered in my medical decision making (see chart for details).    MDM Rules/Calculators/A&P                          I and D of abscess. Sterile dressing applied. Tetanus unknown. Tetanus IM.   Reviewed nursing notes and prior charts for additional history.   Confirmed nkda. Keflex po.   Pt appears stable for d/c.     Final Clinical Impression(s) / ED Diagnoses Final diagnoses:  None    Rx / DC Orders ED Discharge Orders    None       Cathren Laine, MD 03/02/20 1249

## 2020-03-02 NOTE — ED Notes (Signed)
Here for evolving redness to what he reports is an insect bite to his arm

## 2020-03-06 ENCOUNTER — Emergency Department (HOSPITAL_COMMUNITY)
Admission: EM | Admit: 2020-03-06 | Discharge: 2020-03-06 | Disposition: A | Discharge status: Home / Self Care | Payer: Medicaid Other | Attending: Emergency Medicine | Admitting: Emergency Medicine

## 2020-03-06 ENCOUNTER — Encounter (HOSPITAL_COMMUNITY): Payer: Self-pay | Admitting: Emergency Medicine

## 2020-03-06 ENCOUNTER — Other Ambulatory Visit: Payer: Self-pay

## 2020-03-06 DIAGNOSIS — J45909 Unspecified asthma, uncomplicated: Secondary | ICD-10-CM | POA: Insufficient documentation

## 2020-03-06 DIAGNOSIS — L03114 Cellulitis of left upper limb: Secondary | ICD-10-CM

## 2020-03-06 DIAGNOSIS — R2232 Localized swelling, mass and lump, left upper limb: Secondary | ICD-10-CM | POA: Diagnosis present

## 2020-03-06 MED ORDER — DOXYCYCLINE HYCLATE 100 MG PO CAPS: 100.0000 mg | ORAL_CAPSULE | Freq: Two times a day (BID) | ORAL | 0 refills | Status: DC

## 2020-03-06 NOTE — Discharge Instructions (Signed)
Perform warm soaks and compresses twice daily.  Take the antibiotic as prescribed.  He should follow-up in the urgent care in 2 days for recheck of this area.  Return to the ED sooner with spreading redness, pain, weakness, numbness, tingling, fever or any other concerns

## 2020-03-06 NOTE — ED Triage Notes (Signed)
Pt reports he had an I&D on 03/02/2020 on his left forearm. Pt reports the pain and swelling are worse today. Denies fever.

## 2020-03-06 NOTE — ED Provider Notes (Signed)
[facility] EMERGENCY DEPARTMENT Provider Note   CSN: [id number] Arrival date & time: 03/06/20  0301     History Chief Complaint  Patient presents with  . Abscess    Jerome Lam is a 24 y.o. male.  Patient here with redness, pain and swelling to his left forearm for the past 1 day.  States he had some bumps from a "sweat bugs" several days ago.  He underwent a incision and drainage on August 1.  He has been taking Keflex at home which he states compliance with.  He states the wound initially improved but over the past 1 day it has been, increasingly red, swollen and sore.  He states there has been some clear drainage but no bleeding or pus.  He has pain with movement of his left arm.  It is progressively worsening.  No fever, chills, nausea or vomiting.  No focal weakness, numbness or tingling.  Denies any new injury.  Denies any injection drug abuse.  The history is provided by the patient.  Abscess Associated symptoms: no fever, no headaches, no nausea and no vomiting        Past Medical History:  Diagnosis Date  . Asthma   . Attention deficit hyperactivity disorder (ADHD)   . Vomiting    Since December    Patient Active Problem List   Diagnosis Date Noted  . GERD (gastroesophageal reflux disease) 11/08/2012  . Unspecified constipation 10/03/2012  . Abdominal pain, other specified site 10/02/2012  . Vomiting     History reviewed. No pertinent surgical history.     Family History  Problem Relation Age of Onset  . Celiac disease Neg Hx   . Ulcers Neg Hx   . Cholelithiasis Neg Hx     Social History   Tobacco Use  . Smoking status: Never Smoker  . Smokeless tobacco: Never Used  Vaping Use  . Vaping Use: Never used  Substance Use Topics  . Alcohol use: No  . Drug use: Yes    Frequency: 2.0 times per week    Types: Marijuana    Home Medications Prior to Admission medications   Medication Sig Start Date End Date Taking? Authorizing Provider  albuterol  (PROVENTIL HFA;VENTOLIN HFA) 108 (90 BASE) MCG/ACT inhaler Inhale 2 puffs into the lungs every 6 (six) hours as needed. 30 minutes before soccer for asthma symptoms    [provider]  budesonide-formoterol (SYMBICORT) 160-4.5 MCG/ACT inhaler Inhale 2 puffs into the lungs 2 (two) times daily.    [provider]  cephALEXin (KEFLEX) 500 MG capsule Take 2 capsules (1,000 mg total) by mouth 2 (two) times daily. 03/02/20   Cathren Laine, MD  famotidine (PEPCID) 20 MG tablet Take 1 tablet (20 mg total) by mouth 2 (two) times daily. 11/24/17   Burgess Amor, PA-C  fluticasone (CUTIVATE) 0.05 % cream Apply 1 application topically daily as needed. For eczema    [provider]  fluticasone (FLONASE) 50 MCG/ACT nasal spray Place 1 spray into both nostrils 2 (two) times daily.    [provider]  ibuprofen (ADVIL,MOTRIN) 600 MG tablet Take 1 tablet (600 mg total) by mouth 4 (four) times daily. 10/08/16   Ivery Quale, PA-C  levocetirizine (XYZAL) 5 MG tablet Take 5 mg by mouth every morning.    [provider]  lisdexamfetamine (VYVANSE) 30 MG capsule Take 30 mg by mouth daily.    [provider]  methocarbamol (ROBAXIN) 500 MG tablet Take 1 tablet (500 mg total)  by mouth 3 (three) times daily. 10/08/16   Ivery Quale, PA-C  montelukast (SINGULAIR) 10 MG tablet Take 10 mg by mouth every morning.     [provider]  naproxen sodium (ALEVE) 220 MG tablet Take 220-440 mg by mouth daily as needed (for pain).    [provider]  Olopatadine HCl (PATANASE) 0.6 % SOLN Place 1 puff into the nose daily.    [provider]  ondansetron (ZOFRAN ODT) 4 MG disintegrating tablet Take 1 tablet (4 mg total) by mouth every 8 (eight) hours as needed for nausea or vomiting. 11/24/17   Idol, Raynelle Fanning, PA-C    Allergies    Bee venom  Review of Systems   Review of Systems  Constitutional: Negative for activity change, appetite change and fever.  HENT:  Negative for congestion and rhinorrhea.   Respiratory: Negative for cough and shortness of breath.   Cardiovascular: Negative for chest pain.  Gastrointestinal: Negative for abdominal pain, nausea and vomiting.  Genitourinary: Negative for dysuria and hematuria.  Musculoskeletal: Positive for arthralgias, joint swelling and myalgias.  Skin: Positive for wound.  Neurological: Negative for dizziness, weakness and headaches.    all other systems are negative except as noted in the HPI and PMH.   Physical Exam Updated Vital Signs BP 118/67 (BP Location: Right Arm)   Pulse 73   Temp 98.1 F (36.7 C) (Oral)   Resp 15   Ht 5\' 6"  (1.676 m)   Wt 73 kg   SpO2 98%   BMI 25.98 kg/m   Physical Exam Vitals and nursing note reviewed.  Constitutional:      General: He is not in acute distress.    Appearance: He is well-developed.  HENT:     Head: Normocephalic and atraumatic.     Mouth/Throat:     Pharynx: No oropharyngeal exudate.  Eyes:     Conjunctiva/sclera: Conjunctivae normal.     Pupils: Pupils are equal, round, and reactive to light.  Neck:     Comments: No meningismus. Cardiovascular:     Rate and Rhythm: Normal rate and regular rhythm.     Heart sounds: Normal heart sounds. No murmur heard.   Pulmonary:     Effort: Pulmonary effort is normal. No respiratory distress.     Breath sounds: Normal breath sounds.  Abdominal:     Palpations: Abdomen is soft.     Tenderness: There is no abdominal tenderness. There is no guarding or rebound.  Musculoskeletal:        General: Swelling and tenderness present. Normal range of motion.     Cervical back: Normal range of motion and neck supple.     Comments: Erythema and induration to left forearm approximately 5 x 8 cm.  No fluctuance.  Central punctum of previous incision and drainage site has some clear drainage.  No purulence.  Full range of motion of wrist and elbow.  Intact radial pulse.  Compartments are soft  Skin:     General: Skin is warm.  Neurological:     Mental Status: He is alert and oriented to person, place, and time.     Cranial Nerves: No cranial nerve deficit.     Motor: No abnormal muscle tone.     Coordination: Coordination normal.     Comments: No ataxia on finger to nose bilaterally. No pronator drift. 5/5 strength throughout. CN 2-12 intact.Equal grip strength. Sensation intact.   Psychiatric:        Behavior: Behavior normal.  ED Results / Procedures / Treatments   Labs (all labs ordered are listed, but only abnormal results are displayed) Labs Reviewed - No data to display  EKG None  Radiology No results found.  Procedures Ultrasound ED Soft Tissue  Date/Time: 03/06/2020 4:27 AM Performed by: Glynn Octave, MD Authorized by: Glynn Octave, MD   Procedure details:    Indications: localization of abscess and evaluate for cellulitis     Transverse view:  Visualized   Longitudinal view:  Visualized   Images: archived   Location:    Location: upper extremity     Side:  Left Findings:     no abscess present    cellulitis present    no foreign body present   (including critical care time)  Medications Ordered in ED Medications - No data to display  ED Course  I have reviewed the triage vital signs and the nursing notes.  Pertinent labs & imaging results that were available during my care of the patient were reviewed by me and considered in my medical decision making (see chart for details).    MDM Rules/Calculators/A&P                          Worsening redness, pain and erythema after incision and drainage 4 days ago.  Compliant with Keflex.  No fever.  No evidence of septic joint.  Bedside ultrasound does not show any drainable fluid collection at this time.  Discussed warm compresses, soaks, escalate antibiotics for MRSA coverage.  Advised wound check in urgent care in 2 days.  Advised this may need further drainage but there is nothing to  drain at this point.  Return to the ED sooner with worsening pain, spreading redness, fever, difficulty moving arm or any concerns peer Final Clinical Impression(s) / ED Diagnoses Final diagnoses:  Cellulitis of arm, left    Rx / DC Orders ED Discharge Orders    None       Tylee Newby, Jeannett Senior, MD 03/06/20 [phone]

## 2020-07-11 DIAGNOSIS — Z0001 Encounter for general adult medical examination with abnormal findings: Secondary | ICD-10-CM | POA: Diagnosis not present

## 2020-07-11 DIAGNOSIS — J4540 Moderate persistent asthma, uncomplicated: Secondary | ICD-10-CM | POA: Diagnosis not present

## 2020-07-11 DIAGNOSIS — Z113 Encounter for screening for infections with a predominantly sexual mode of transmission: Secondary | ICD-10-CM | POA: Diagnosis not present

## 2020-09-15 ENCOUNTER — Emergency Department (HOSPITAL_COMMUNITY)
Admission: EM | Admit: 2020-09-15 | Discharge: 2020-09-16 | Disposition: A | Discharge status: Home / Self Care | Payer: Medicaid Other | Attending: Emergency Medicine | Admitting: Emergency Medicine

## 2020-09-15 ENCOUNTER — Other Ambulatory Visit: Payer: Self-pay

## 2020-09-15 ENCOUNTER — Encounter (HOSPITAL_COMMUNITY): Payer: Self-pay | Admitting: Emergency Medicine

## 2020-09-15 ENCOUNTER — Emergency Department (HOSPITAL_COMMUNITY): Payer: Medicaid Other

## 2020-09-15 DIAGNOSIS — J45909 Unspecified asthma, uncomplicated: Secondary | ICD-10-CM | POA: Insufficient documentation

## 2020-09-15 DIAGNOSIS — W228XXA Striking against or struck by other objects, initial encounter: Secondary | ICD-10-CM | POA: Insufficient documentation

## 2020-09-15 DIAGNOSIS — Y990 Civilian activity done for income or pay: Secondary | ICD-10-CM | POA: Insufficient documentation

## 2020-09-15 DIAGNOSIS — S62630B Displaced fracture of distal phalanx of right index finger, initial encounter for open fracture: Secondary | ICD-10-CM | POA: Insufficient documentation

## 2020-09-15 DIAGNOSIS — Z7952 Long term (current) use of systemic steroids: Secondary | ICD-10-CM | POA: Insufficient documentation

## 2020-09-15 DIAGNOSIS — S6991XA Unspecified injury of right wrist, hand and finger(s), initial encounter: Secondary | ICD-10-CM | POA: Diagnosis present

## 2020-09-15 NOTE — ED Notes (Signed)
No answer for triage x1 

## 2020-09-15 NOTE — ED Triage Notes (Signed)
Patient reports injury to right distal index finger with laceration sustained while at work this morning , dressing applied prior to arrival.

## 2020-09-16 MED ORDER — HYDROCODONE-ACETAMINOPHEN 5-325 MG PO TABS
1.0000 | ORAL_TABLET | Freq: Once | ORAL | Status: AC
  Administered 2020-09-16: 1 via ORAL
  Filled 2020-09-16: qty 1

## 2020-09-16 MED ORDER — CEFAZOLIN SODIUM 1 G IJ SOLR
1.0000 g | Freq: Once | INTRAMUSCULAR | Status: AC
  Administered 2020-09-16: 1 g via INTRAMUSCULAR
  Filled 2020-09-16: qty 10

## 2020-09-16 MED ORDER — IBUPROFEN 600 MG PO TABS: 600.0000 mg | ORAL_TABLET | Freq: Four times a day (QID) | ORAL | 0 refills | Status: DC | PRN

## 2020-09-16 MED ORDER — CEPHALEXIN 500 MG PO CAPS: 1000.0000 mg | ORAL_CAPSULE | Freq: Two times a day (BID) | ORAL | 0 refills | Status: DC

## 2020-09-16 MED ORDER — IBUPROFEN 400 MG PO TABS
600.0000 mg | ORAL_TABLET | Freq: Once | ORAL | Status: AC
  Administered 2020-09-16: 600 mg via ORAL
  Filled 2020-09-16: qty 1

## 2020-09-16 NOTE — ED Notes (Signed)
Patient educated on importance of not driving after taking narcotics. Verbalized understanding, says brother will pick him up from ED.

## 2020-09-16 NOTE — Discharge Instructions (Signed)
Keep wound clean, watch for signs of infection such as pus draining, fevers, spreading redness.  Follow-up with hand surgeon for further recommendation.  Take antibiotics as directed.  Use Tylenol and ibuprofen every 6 hours as needed for pain, ice as needed.

## 2020-09-16 NOTE — ED Provider Notes (Signed)
[facility] EMERGENCY DEPARTMENT Provider Note   CSN: [id number] Arrival date & time: 09/15/20  2037     History No chief complaint on file.   Jerome Lam is a 25 y.o. male.  Patient presents for assessment of right distal index finger injury that occurred yesterday morning at work due to rebar.  Patient had tetanus shot given there.  Patient's had persistent pain since then was given prescriptions however he was in [place] so unable to fill.  Patient was told to follow-up with hand surgery.  [facility]  Patient has no weakness with movement of his distal finger.        Past Medical History:  Diagnosis Date  . Asthma   . Attention deficit hyperactivity disorder (ADHD)   . Vomiting    Since December    Patient Active Problem List   Diagnosis Date Noted  . GERD (gastroesophageal reflux disease) 11/08/2012  . Unspecified constipation 10/03/2012  . Abdominal pain, other specified site 10/02/2012  . Vomiting     History reviewed. No pertinent surgical history.     Family History  Problem Relation Age of Onset  . Celiac disease Neg Hx   . Ulcers Neg Hx   . Cholelithiasis Neg Hx     Social History   Tobacco Use  . Smoking status: Never Smoker  . Smokeless tobacco: Never Used  Vaping Use  . Vaping Use: Never used  Substance Use Topics  . Alcohol use: No  . Drug use: Yes    Frequency: 2.0 times per week    Types: Marijuana    Home Medications Prior to Admission medications   Medication Sig Start Date End Date Taking? Authorizing Provider  albuterol (PROVENTIL HFA;VENTOLIN HFA) 108 (90 BASE) MCG/ACT inhaler Inhale 2 puffs into the lungs every 6 (six) hours as needed. 30 minutes before soccer for asthma symptoms    [provider]  budesonide-formoterol (SYMBICORT) 160-4.5 MCG/ACT inhaler Inhale 2 puffs into the lungs 2 (two) times daily.    [provider]  cephALEXin (KEFLEX) 500 MG  capsule Take 2 capsules (1,000 mg total) by mouth 2 (two) times daily. 09/16/20   Blane Ohara, MD  doxycycline (VIBRAMYCIN) 100 MG capsule Take 1 capsule (100 mg total) by mouth 2 (two) times daily. 03/06/20   Rancour, Jeannett Senior, MD  famotidine (PEPCID) 20 MG tablet Take 1 tablet (20 mg total) by mouth 2 (two) times daily. 11/24/17   Burgess Amor, PA-C  fluticasone (CUTIVATE) 0.05 % cream Apply 1 application topically daily as needed. For eczema    [provider]  fluticasone (FLONASE) 50 MCG/ACT nasal spray Place 1 spray into both nostrils 2 (two) times daily.    [provider]  ibuprofen (ADVIL,MOTRIN) 600 MG tablet Take 1 tablet (600 mg total) by mouth 4 (four) times daily. 10/08/16   Ivery Quale, PA-C  levocetirizine (XYZAL) 5 MG tablet Take 5 mg by mouth every morning.    [provider]  lisdexamfetamine (VYVANSE) 30 MG capsule Take 30 mg by mouth daily.    [provider]  methocarbamol (ROBAXIN) 500 MG tablet Take 1 tablet (500 mg total) by mouth 3 (three) times daily. 10/08/16   Ivery Quale, PA-C  montelukast (SINGULAIR) 10 MG tablet Take 10 mg by mouth every morning.     [provider]  naproxen sodium (ALEVE) 220 MG tablet Take 220-440 mg by mouth daily as needed (for pain).    [provider]  Olopatadine HCl (PATANASE) 0.6 % SOLN Place 1 puff into the nose daily.    [provider]  ondansetron (ZOFRAN ODT) 4 MG disintegrating tablet Take 1 tablet (4 mg total) by mouth every 8 (eight) hours as needed for nausea or vomiting. 11/24/17   Idol, Raynelle Fanning, PA-C    Allergies    Bee venom  Review of Systems   Review of Systems  Constitutional: Negative for chills and fever.  HENT: Negative for congestion.   Eyes: Negative for visual disturbance.  Respiratory: Negative for shortness of breath.   Cardiovascular: Negative for chest pain.  Gastrointestinal: Negative for abdominal pain and vomiting.  Genitourinary: Negative for  dysuria and flank pain.  Musculoskeletal: Negative for back pain, neck pain and neck stiffness.  Skin: Positive for wound. Negative for rash.  Neurological: Negative for light-headedness and headaches.    Physical Exam Updated Vital Signs BP 122/74 (BP Location: Right Arm)   Pulse (!) 56   Temp (!) 97.5 F (36.4 C) (Oral)   Resp 16   Ht 5\' 6"  (1.676 m)   Wt 80 kg   SpO2 99%   BMI 28.47 kg/m   Physical Exam Vitals and nursing note reviewed.  Constitutional:      Appearance: He is well-developed and well-nourished.  HENT:     Head: Normocephalic and atraumatic.  Eyes:     General:        Right eye: No discharge.        Left eye: No discharge.     Conjunctiva/sclera: Conjunctivae normal.  Neck:     Trachea: No tracheal deviation.  Cardiovascular:     Rate and Rhythm: Normal rate.  Pulmonary:     Effort: Pulmonary effort is normal.  Musculoskeletal:        General: Swelling, tenderness, deformity and signs of injury present. No edema.     Cervical back: Normal range of motion.  Skin:    General: Skin is warm.     Findings: No rash.     Comments: Patient has avulsed proximal 50% of nail pointer finger on the right, no active bleeding, laceration to nail bed.  No laceration to palmar aspect of finger.  Patient 5+ strength with flexion and extension of tendons distally.  Neurological:     Mental Status: He is alert and oriented to person, place, and time.  Psychiatric:        Mood and Affect: Mood and affect normal.     ED Results / Procedures / Treatments   Labs (all labs ordered are listed, but only abnormal results are displayed) Labs Reviewed - No data to display  EKG None  Radiology DG Finger Index Right  Result Date: 09/15/2020 CLINICAL DATA:  Laceration EXAM: RIGHT INDEX FINGER 2+V COMPARISON:  None. FINDINGS: Acute mildly comminuted fracture involving the tuft of the distal phalanx. Punctate density at the nail bed. IMPRESSION: Acute mildly comminuted  fracture involving the tuft of the distal phalanx. Electronically Signed   By: 09/17/2020 M.D.   On: 09/15/2020 21:57    Procedures Procedures   Medications Ordered in ED Medications  ceFAZolin (ANCEF) injection 1 g (has no administration in time range)  ibuprofen (ADVIL) tablet 600 mg (has no administration in time range)  HYDROcodone-acetaminophen (NORCO/VICODIN) 5-325 MG per tablet 1 tablet (has no administration in time range)    ED Course  I have reviewed the triage vital signs and the nursing notes.  Pertinent labs & imaging results that were  available during my care of the patient were reviewed by me and considered in my medical decision making (see chart for details).    MDM Rules/Calculators/A&P                          Patient presents for assessment of distal finger injury.  X-ray reviewed showing tuft fracture, concern for open extension given nail avulsion.  Patient presents with significant delay in time since injury and no indication for emergent laceration repair of the nailbed.  Ancef given, oral antibiotic prescription refilled and follow-up with hand surgery discussed.  Final Clinical Impression(s) / ED Diagnoses Final diagnoses:  Displaced fracture of distal phalanx of right index finger, initial encounter for open fracture    Rx / DC Orders ED Discharge Orders         Ordered    cephALEXin (KEFLEX) 500 MG capsule  2 times daily        09/16/20 0350           Blane Ohara, MD 09/16/20 [phone]

## 2021-01-25 ENCOUNTER — Emergency Department (HOSPITAL_COMMUNITY)
Admission: EM | Admit: 2021-01-25 | Discharge: 2021-01-25 | Disposition: A | Discharge status: Home / Self Care | Payer: Medicaid Other | Attending: Emergency Medicine | Admitting: Emergency Medicine

## 2021-01-25 ENCOUNTER — Other Ambulatory Visit: Payer: Self-pay

## 2021-01-25 ENCOUNTER — Encounter (HOSPITAL_COMMUNITY): Payer: Self-pay | Admitting: *Deleted

## 2021-01-25 DIAGNOSIS — J020 Streptococcal pharyngitis: Secondary | ICD-10-CM | POA: Diagnosis not present

## 2021-01-25 DIAGNOSIS — Z20822 Contact with and (suspected) exposure to covid-19: Secondary | ICD-10-CM | POA: Diagnosis not present

## 2021-01-25 DIAGNOSIS — R52 Pain, unspecified: Secondary | ICD-10-CM

## 2021-01-25 DIAGNOSIS — R42 Dizziness and giddiness: Secondary | ICD-10-CM | POA: Insufficient documentation

## 2021-01-25 DIAGNOSIS — Z28310 Unvaccinated for covid-19: Secondary | ICD-10-CM | POA: Diagnosis not present

## 2021-01-25 DIAGNOSIS — Z7951 Long term (current) use of inhaled steroids: Secondary | ICD-10-CM | POA: Insufficient documentation

## 2021-01-25 DIAGNOSIS — J45909 Unspecified asthma, uncomplicated: Secondary | ICD-10-CM | POA: Diagnosis not present

## 2021-01-25 DIAGNOSIS — M791 Myalgia, unspecified site: Secondary | ICD-10-CM | POA: Diagnosis not present

## 2021-01-25 LAB — CBC WITH DIFFERENTIAL/PLATELET
Abs Immature Granulocytes: 0.01 10*3/uL (ref 0.00–0.07)
Basophils Absolute: 0.1 10*3/uL (ref 0.0–0.1)
Basophils Relative: 1 %
Eosinophils Absolute: 0.2 10*3/uL (ref 0.0–0.5)
Eosinophils Relative: 4 %
HCT: 43.2 % (ref 39.0–52.0)
Hemoglobin: 15.6 g/dL (ref 13.0–17.0)
Immature Granulocytes: 0 %
Lymphocytes Relative: 13 %
Lymphs Abs: 0.6 10*3/uL — ABNORMAL LOW (ref 0.7–4.0)
MCH: 34.3 pg — ABNORMAL HIGH (ref 26.0–34.0)
MCHC: 36.1 g/dL — ABNORMAL HIGH (ref 30.0–36.0)
MCV: 94.9 fL (ref 80.0–100.0)
Monocytes Absolute: 0.5 10*3/uL (ref 0.1–1.0)
Monocytes Relative: 11 %
Neutro Abs: 3.2 10*3/uL (ref 1.7–7.7)
Neutrophils Relative %: 71 %
Platelets: 168 10*3/uL (ref 150–400)
RBC: 4.55 MIL/uL (ref 4.22–5.81)
RDW: 12.1 % (ref 11.5–15.5)
WBC: 4.5 10*3/uL (ref 4.0–10.5)
nRBC: 0 % (ref 0.0–0.2)

## 2021-01-25 LAB — GROUP A STREP BY PCR: Group A Strep by PCR: DETECTED — AB

## 2021-01-25 LAB — COMPREHENSIVE METABOLIC PANEL
ALT: 20 U/L (ref 0–44)
AST: 28 U/L (ref 15–41)
Albumin: 4 g/dL (ref 3.5–5.0)
Alkaline Phosphatase: 68 U/L (ref 38–126)
Anion gap: 6 (ref 5–15)
BUN: 9 mg/dL (ref 6–20)
CO2: 28 mmol/L (ref 22–32)
Calcium: 9.2 mg/dL (ref 8.9–10.3)
Chloride: 103 mmol/L (ref 98–111)
Creatinine, Ser: 0.77 mg/dL (ref 0.61–1.24)
GFR, Estimated: >60 mL/min (ref >60)
Glucose, Bld: 96 mg/dL (ref 70–99)
Potassium: 3.9 mmol/L (ref 3.5–5.1)
Sodium: 137 mmol/L (ref 135–145)
Total Bilirubin: 3.2 mg/dL — ABNORMAL HIGH (ref 0.3–1.2)
Total Protein: 7.1 g/dL (ref 6.5–8.1)

## 2021-01-25 LAB — RESP PANEL BY RT-PCR (FLU A&B, COVID) ARPGX2
Influenza A by PCR: NEGATIVE
Influenza B by PCR: NEGATIVE
SARS Coronavirus 2 by RT PCR: NEGATIVE

## 2021-01-25 MED ORDER — PENICILLIN G BENZATHINE 1200000 UNIT/2ML IM SUSY
1.2000 10*6.[IU] | PREFILLED_SYRINGE | Freq: Once | INTRAMUSCULAR | Status: AC
  Administered 2021-01-25: 1.2 10*6.[IU] via INTRAMUSCULAR
  Filled 2021-01-25: qty 2

## 2021-01-25 MED ORDER — ONDANSETRON HCL 4 MG/2ML IJ SOLN
4.0000 mg | Freq: Once | INTRAMUSCULAR | Status: AC
  Administered 2021-01-25: 4 mg via INTRAVENOUS
  Filled 2021-01-25: qty 2

## 2021-01-25 MED ORDER — DICYCLOMINE HCL 10 MG/ML IM SOLN
20.0000 mg | Freq: Once | INTRAMUSCULAR | Status: AC
  Administered 2021-01-25: 20 mg via INTRAMUSCULAR
  Filled 2021-01-25: qty 2

## 2021-01-25 MED ORDER — SODIUM CHLORIDE 0.9 % IV BOLUS
1000.0000 mL | Freq: Once | INTRAVENOUS | Status: AC
  Administered 2021-01-25: 1000 mL via INTRAVENOUS

## 2021-01-25 NOTE — ED Provider Notes (Signed)
[facility] EMERGENCY DEPARTMENT Provider Note   CSN: [id number] Arrival date & time: 01/25/21  5456     History Chief Complaint  Patient presents with   Dizziness    Jerome Lam is a 25 y.o. male.  HPI  Patient with no significant medical history presents to the emergency department with chief complaint of generalized body aches, dizziness.  Patient states this started yesterday but has gotten worse.  Patient states his entire body hurts, he feels as if all of his muscles are cramping at the same time, he denies alleviating or aggravating factors.  He also notes that he feels dizzy when he changes positions, feels as if the room is spinning and he has to lay down, states after he lays down this helps resolve it.  He suspect he might be dehydrated.  Patient notes that he has a slight cough which is nonproductive, has a sore throat, and has a lack of appetite, states he just does not want to eat, he has occasional nausea but has not vomited.  He denies systemic infection fevers, chills, constipation, diarrhea, urinary symptoms.  Patient was recently on vacation with family, he denies recent sick contacts, is not vaccinated against COVID or influenza, he is not immunocompromise.  Patient does note that his stool looked slightly dark 2 days ago but this is since resolved he has no other complaints at this time.  Past Medical History:  Diagnosis Date   Asthma    Attention deficit hyperactivity disorder (ADHD)    Vomiting    Since December    Patient Active Problem List   Diagnosis Date Noted   GERD (gastroesophageal reflux disease) 11/08/2012   Unspecified constipation 10/03/2012   Abdominal pain, other specified site 10/02/2012   Vomiting     History reviewed. No pertinent surgical history.     Family History  Problem Relation Age of Onset   Celiac disease Neg Hx    Ulcers Neg Hx    Cholelithiasis Neg Hx     Social History   Tobacco Use   Smoking status: Never    Smokeless tobacco: Never  Vaping Use   Vaping Use: Never used  Substance Use Topics   Alcohol use: Yes    Comment: occasionally   Drug use: Yes    Frequency: 2.0 times per week    Types: Marijuana    Home Medications Prior to Admission medications   Medication Sig Start Date End Date Taking? Authorizing Provider  albuterol (PROVENTIL HFA;VENTOLIN HFA) 108 (90 BASE) MCG/ACT inhaler Inhale 2 puffs into the lungs every 6 (six) hours as needed. 30 minutes before soccer for asthma symptoms    [provider]  budesonide-formoterol (SYMBICORT) 160-4.5 MCG/ACT inhaler Inhale 2 puffs into the lungs 2 (two) times daily.    [provider]  cephALEXin (KEFLEX) 500 MG capsule Take 2 capsules (1,000 mg total) by mouth 2 (two) times daily. 09/16/20   Blane Ohara, MD  doxycycline (VIBRAMYCIN) 100 MG capsule Take 1 capsule (100 mg total) by mouth 2 (two) times daily. 03/06/20   Rancour, Jeannett Senior, MD  famotidine (PEPCID) 20 MG tablet Take 1 tablet (20 mg total) by mouth 2 (two) times daily. 11/24/17   Burgess Amor, PA-C  fluticasone (CUTIVATE) 0.05 % cream Apply 1 application topically daily as needed. For eczema    [provider]  fluticasone (FLONASE) 50 MCG/ACT nasal spray Place 1 spray into both nostrils 2 (two) times daily.    [provider]  ibuprofen (  ADVIL) 600 MG tablet Take 1 tablet (600 mg total) by mouth every 6 (six) hours as needed. 09/16/20   Blane OharaZavitz, Joshua, MD  ibuprofen (ADVIL,MOTRIN) 600 MG tablet Take 1 tablet (600 mg total) by mouth 4 (four) times daily. 10/08/16   Ivery QualeBryant, Hobson, PA-C  levocetirizine (XYZAL) 5 MG tablet Take 5 mg by mouth every morning.    [provider]  lisdexamfetamine (VYVANSE) 30 MG capsule Take 30 mg by mouth daily.    [provider]  methocarbamol (ROBAXIN) 500 MG tablet Take 1 tablet (500 mg total) by mouth 3 (three) times daily. 10/08/16   Ivery QualeBryant, Hobson, PA-C  montelukast (SINGULAIR) 10 MG tablet Take 10 mg by  mouth every morning.     [provider]  Olopatadine HCl (PATANASE) 0.6 % SOLN Place 1 puff into the nose daily.    [provider]  ondansetron (ZOFRAN ODT) 4 MG disintegrating tablet Take 1 tablet (4 mg total) by mouth every 8 (eight) hours as needed for nausea or vomiting. 11/24/17   Idol, Raynelle FanningJulie, PA-C    Allergies    Bee venom  Review of Systems   Review of Systems  Constitutional:  Negative for chills and fever.  HENT:  Positive for sore throat. Negative for congestion.   Respiratory:  Positive for cough. Negative for shortness of breath.   Cardiovascular:  Negative for chest pain.  Gastrointestinal:  Positive for abdominal pain and nausea. Negative for constipation, diarrhea and vomiting.  Genitourinary:  Negative for enuresis.  Musculoskeletal:  Positive for myalgias. Negative for back pain.  Skin:  Negative for rash.  Neurological:  Positive for dizziness and headaches.  Hematological:  Does not bruise/bleed easily.   Physical Exam Updated Vital Signs BP (!) 114/58   Pulse (!) 53   Temp 98 F (36.7 C) (Oral)   Resp 18   Ht 5\' 6"  (1.676 m)   Wt 71.2 kg   SpO2 100%   BMI 25.34 kg/m   Physical Exam Vitals and nursing note reviewed.  Constitutional:      General: He is not in acute distress.    Appearance: He is not ill-appearing.  HENT:     Head: Normocephalic and atraumatic.     Right Ear: Tympanic membrane, ear canal and external ear normal.     Left Ear: Tympanic membrane, ear canal and external ear normal.     Nose: Congestion present.     Comments: Patient has erythematous turbinates    Mouth/Throat:     Mouth: Mucous membranes are moist.     Pharynx: Oropharyngeal exudate and posterior oropharyngeal erythema present.     Comments: Oropharynx is visualized tongue and uvula are both midline, controlling oral secretions, he has a noted exudates on the left tonsil, slightly erythematous.  Eyes:     Conjunctiva/sclera: Conjunctivae normal.   Cardiovascular:     Rate and Rhythm: Normal rate and regular rhythm.     Pulses: Normal pulses.     Heart sounds: No murmur heard.   No friction rub. No gallop.  Pulmonary:     Effort: No respiratory distress.     Breath sounds: No wheezing, rhonchi or rales.  Abdominal:     Palpations: Abdomen is soft.     Tenderness: There is abdominal tenderness. There is no right CVA tenderness or left CVA tenderness.     Comments: Abdomen was visualized nondistended, normative bowel sounds, dull to percussion, patient was slightly tender to palpation his lower abdomen, no guarding, rebound  tenderness, peritoneal sign.  Negative CVA tenderness  Musculoskeletal:     Right lower leg: No edema.     Left lower leg: No edema.  Skin:    General: Skin is warm and dry.  Neurological:     Mental Status: He is alert.     Comments: No facial asymmetry, no difficulty with word finding, patient is appropriately moving all 4 extremities.  Psychiatric:        Mood and Affect: Mood normal.    ED Results / Procedures / Treatments   Labs (all labs ordered are listed, but only abnormal results are displayed) Labs Reviewed  GROUP A STREP BY PCR - Abnormal; Notable for the following components:      Result Value   Group A Strep by PCR DETECTED (*)    All other components within normal limits  COMPREHENSIVE METABOLIC PANEL - Abnormal; Notable for the following components:   Total Bilirubin 3.2 (*)    All other components within normal limits  CBC WITH DIFFERENTIAL/PLATELET - Abnormal; Notable for the following components:   MCH 34.3 (*)    MCHC 36.1 (*)    Lymphs Abs 0.6 (*)    All other components within normal limits  RESP PANEL BY RT-PCR (FLU A&B, COVID) ARPGX2    EKG None  Radiology No results found.  Procedures Procedures   Medications Ordered in ED Medications  sodium chloride 0.9 % bolus 1,000 mL (0 mLs Intravenous Stopped 01/25/21 1148)  ondansetron (ZOFRAN) injection 4 mg (4 mg  Intravenous Given 01/25/21 1035)  dicyclomine (BENTYL) injection 20 mg (20 mg Intramuscular Given 01/25/21 1039)  penicillin g benzathine (BICILLIN LA) 1200000 UNIT/2ML injection 1.2 Million Units (1.2 Million Units Intramuscular Given 01/25/21 1210)    ED Course  I have reviewed the triage vital signs and the nursing notes.  Pertinent labs & imaging results that were available during my care of the patient were reviewed by me and considered in my medical decision making (see chart for details).    MDM Rules/Calculators/A&P                         Initial impression-patient presents with general body aches and dizziness.  He is alert, does not appear in acute stress, vital signs reassuring.  Suspect viral URI, will obtain respiratory panel, strep test, basic lab work-up, provide patient fluids antiemetics Bentyl and reassess.  Work-up-CBC unremarkable, CMP shows slight elevated T bili of 3.2, strep positive, respiratory panel negative  Reassessment-patient was reassessed, states he is feeling better, has no other complaints at this time, vital signs remained stable.  Patient is in agreement with antibiotic treatment, and is ready for discharge after.  Rule out- Low suspicion for systemic infection as patient is nontoxic-appearing, vital signs reassuring, no obvious source infection noted on exam.  Low suspicion for pneumonia as lung sounds are clear bilaterally, will defer imaging at this time.   I have low suspicion for PE as patient denies pleuritic chest pain, shortness of breath, patient is PERC.  Low suspicion for intracranial head bleed as patient denies change in vision, paresthesias or weakness upper lower extremities no focal deficits present on exam.  Patient does endorse dizziness but suspect is secondary due to dehydration.  low suspicion for meningitis as there is no meningeal sign noted.  Low suspicion patient would need  hospitalized due to viral infection or Covid as vital signs  reassuring, patient is not in respiratory distress.  Plan-  General body aches sore throat-I suspect patient some from strep throat with viral URI.  Patient was given 1,200,000 units of penicillin will recommend over-the-counter pain medications, follow-up with PCP as needed.  Vital signs have remained stable, no indication for hospital admission.  Patient given at home care as well strict return precautions.  Patient verbalized that they understood agreed to said plan.  Final Clinical Impression(s) / ED Diagnoses Final diagnoses:  Dizziness  Body aches  Strep pharyngitis    Rx / DC Orders ED Discharge Orders     None        Carroll Sage, PA-C 01/25/21 1219    Horton, Clabe Seal, DO 01/25/21 1400

## 2021-01-25 NOTE — Discharge Instructions (Addendum)
Your strep test was positive, I have given you antibiotics here in the emergency department.  I recommend taking Tylenol for fever control and ibuprofen for pain control please follow dosing on the back of bottle.  I recommend staying hydrated and if you do not an appetite, I recommend soups as this will provide you with fluids and calories.  Your Covid test is negative  Please follow-up with your PCP as needed if symptoms not fully resolved.  Come back to the emergency department if you develop chest pain, shortness of breath, severe abdominal pain, uncontrolled nausea, vomiting, diarrhea.

## 2021-01-25 NOTE — ED Triage Notes (Signed)
Pt c/o dizziness when standing, nausea, bodyaches x 2 days. Dark, tarry stools 2 days ago, but none since. Pt reports hx of dehydration and hyponatremia. Pt just [place] the [place].

## 2022-03-30 DIAGNOSIS — M25561 Pain in right knee: Secondary | ICD-10-CM | POA: Diagnosis not present

## 2022-04-07 DIAGNOSIS — M25561 Pain in right knee: Secondary | ICD-10-CM | POA: Diagnosis not present

## 2022-05-05 DIAGNOSIS — S83511D Sprain of anterior cruciate ligament of right knee, subsequent encounter: Secondary | ICD-10-CM | POA: Diagnosis not present

## 2022-05-14 NOTE — Patient Instructions (Signed)
DUE TO COVID-19 ONLY TWO VISITORS  (aged 24 and older)  ARE ALLOWED TO COME WITH YOU AND STAY IN THE WAITING ROOM ONLY DURING PRE OP AND PROCEDURE.   **NO VISITORS ARE ALLOWED IN THE SHORT STAY AREA OR RECOVERY ROOM!!**  IF YOU WILL BE ADMITTED INTO THE HOSPITAL YOU ARE ALLOWED ONLY FOUR SUPPORT PEOPLE DURING VISITATION HOURS ONLY (7 AM -8PM)   The support person(s) must pass our screening, gel in and out, and wear a mask at all times, including in the patient's room. Patients must also wear a mask when staff or their support person are in the room. Visitors GUEST BADGE MUST BE WORN VISIBLY  One adult visitor may remain with you overnight and MUST be in the room by 8 P.M.     Your procedure is scheduled on: 05/24/22   Report to [facility] Main Entrance    Report to admitting at : 11:45 AM   Call this number if you have problems the morning of surgery [phone]   Do not eat food :After Midnight.   After Midnight you may have the following liquids until : 11:00 AM DAY OF SURGERY  Water Black Coffee (sugar ok, NO MILK/CREAM OR CREAMERS)  Tea (sugar ok, NO MILK/CREAM OR CREAMERS) regular and decaf                             Plain Jell-O (NO RED)                                           Fruit ices (not with fruit pulp, NO RED)                                     Popsicles (NO RED)                                                                  Juice: apple, WHITE grape, WHITE cranberry Sports drinks like Gatorade (NO RED)              Drink Ensure drink AT : 11:00 AM the day of surgery.    The day of surgery:  Drink ONE (1) Pre-Surgery Clear Ensure or G2 at AM the morning of surgery. Drink in one sitting. Do not sip.  This drink was given to you during your hospital  pre-op appointment visit. Nothing else to drink after completing the  Pre-Surgery Clear Ensure or G2.          If you have questions, please contact your surgeon's office.  Oral Hygiene is also  important to reduce your risk of infection.                                    Remember - BRUSH YOUR TEETH THE MORNING OF SURGERY WITH YOUR REGULAR TOOTHPASTE   Do NOT smoke after Midnight   Take these medicines the morning of surgery with A SIP OF WATER:  levocetirizine.Use inhalers as usual.                    You may not have any metal on your body including hair pins, jewelry, and body piercing             Do not wear lotions, powders, perfumes/cologne, or deodorant              Men may shave face and neck.   Do not bring valuables to the hospital. [place] IS NOT             RESPONSIBLE   FOR VALUABLES.   Contacts, dentures or bridgework may not be worn into surgery.   Bring small overnight bag day of surgery.   DO NOT BRING YOUR HOME MEDICATIONS TO THE HOSPITAL. PHARMACY WILL DISPENSE MEDICATIONS LISTED ON YOUR MEDICATION LIST TO YOU DURING YOUR ADMISSION IN THE HOSPITAL!    Patients discharged on the day of surgery will not be allowed to drive home.  Someone NEEDS to stay with you for the first 24 hours after anesthesia.   Special Instructions: Bring a copy of your healthcare power of attorney and living will documents         the day of surgery if you haven't scanned them before.              Please read over the following fact sheets you were given: IF YOU HAVE QUESTIONS ABOUT YOUR PRE-OP INSTRUCTIONS PLEASE CALL [phone]     [facility] - Preparing for Surgery Before surgery, you can play an important role.  Because skin is not sterile, your skin needs to be as free of germs as possible.  You can reduce the number of germs on your skin by washing with CHG (chlorahexidine gluconate) soap before surgery.  CHG is an antiseptic cleaner which kills germs and bonds with the skin to continue killing germs even after washing. Please DO NOT use if you have an allergy to CHG or antibacterial soaps.  If your skin becomes reddened/irritated stop using the CHG and inform your nurse  when you arrive at Short Stay. Do not shave (including legs and underarms) for at least 48 hours prior to the first CHG shower.  You may shave your face/neck. Please follow these instructions carefully:  1.  Shower with CHG Soap the night before surgery and the  morning of Surgery.  2.  If you choose to wash your hair, wash your hair first as usual with your  normal  shampoo.  3.  After you shampoo, rinse your hair and body thoroughly to remove the  shampoo.                           4.  Use CHG as you would any other liquid soap.  You can apply chg directly  to the skin and wash                       Gently with a scrungie or clean washcloth.  5.  Apply the CHG Soap to your body ONLY FROM THE NECK DOWN.   Do not use on face/ open                           Wound or open sores. Avoid contact with eyes, ears mouth and genitals (private parts).  Wash face,  Genitals (private parts) with your normal soap.             6.  Wash thoroughly, paying special attention to the area where your surgery  will be performed.  7.  Thoroughly rinse your body with warm water from the neck down.  8.  DO NOT shower/wash with your normal soap after using and rinsing off  the CHG Soap.                9.  Pat yourself dry with a clean towel.            10.  Wear clean pajamas.            11.  Place clean sheets on your bed the night of your first shower and do not  sleep with pets. Day of Surgery : Do not apply any lotions/deodorants the morning of surgery.  Please wear clean clothes to the hospital/surgery center.  FAILURE TO FOLLOW THESE INSTRUCTIONS MAY RESULT IN THE CANCELLATION OF YOUR SURGERY PATIENT SIGNATURE_________________________________  NURSE SIGNATURE__________________________________  ________________________________________________________________________

## 2022-05-17 ENCOUNTER — Encounter (HOSPITAL_COMMUNITY): Payer: Self-pay

## 2022-05-17 ENCOUNTER — Other Ambulatory Visit: Payer: Self-pay

## 2022-05-17 ENCOUNTER — Encounter (HOSPITAL_COMMUNITY)
Admission: RE | Admit: 2022-05-17 | Discharge: 2022-05-17 | Disposition: A | Discharge status: Home / Self Care | Payer: Medicaid Other | Source: Ambulatory Visit | Attending: Orthopedic Surgery | Admitting: Orthopedic Surgery

## 2022-05-17 VITALS — BP 145/90 | HR 65 | Temp 97.8°F | Resp 18 | Ht 66.0 in | Wt 168.0 lb

## 2022-05-17 DIAGNOSIS — Z01812 Encounter for preprocedural laboratory examination: Secondary | ICD-10-CM | POA: Diagnosis not present

## 2022-05-17 DIAGNOSIS — Z01818 Encounter for other preprocedural examination: Secondary | ICD-10-CM

## 2022-05-17 HISTORY — DX: Depression, unspecified: F32.A

## 2022-05-17 LAB — CBC
HCT: 45.4 % (ref 39.0–52.0)
Hemoglobin: 15.6 g/dL (ref 13.0–17.0)
MCH: 32.6 pg (ref 26.0–34.0)
MCHC: 34.4 g/dL (ref 30.0–36.0)
MCV: 95 fL (ref 80.0–100.0)
Platelets: 195 10*3/uL (ref 150–400)
RBC: 4.78 MIL/uL (ref 4.22–5.81)
RDW: 11.9 % (ref 11.5–15.5)
WBC: 5.8 10*3/uL (ref 4.0–10.5)
nRBC: 0 % (ref 0.0–0.2)

## 2022-05-17 NOTE — Progress Notes (Signed)
For Short Stay: [place] appointment date: Date of COVID positive in last 76 days:  Bowel Prep reminder:   For Anesthesia: PCP - Dr. Harrie Jeans Cardiologist -   Chest x-ray -  EKG -  Stress Test -  ECHO -  Cardiac Cath -  Pacemaker/ICD device last checked: Pacemaker orders received: Device Rep notified:  Spinal Cord Stimulator:  Sleep Study -  CPAP -   Fasting Blood Sugar -  Checks Blood Sugar _____ times a day Date and result of last Hgb A1c-  Blood Thinner Instructions: Aspirin Instructions: Last Dose:  Activity level: Can go up a flight of stairs and activities of daily living without stopping and without chest pain and/or shortness of breath   Able to exercise without chest pain and/or shortness of breath   Unable to go up a flight of stairs without chest pain and/or shortness of breath     Anesthesia review:   Patient denies shortness of breath, fever, cough and chest pain at PAT appointment   Patient verbalized understanding of instructions that were given to them at the PAT appointment. Patient was also instructed that they will need to review over the PAT instructions again at home before surgery.

## 2022-05-24 ENCOUNTER — Other Ambulatory Visit: Payer: Self-pay

## 2022-05-24 ENCOUNTER — Ambulatory Visit (HOSPITAL_COMMUNITY): Payer: Medicaid Other | Admitting: Certified Registered Nurse Anesthetist

## 2022-05-24 ENCOUNTER — Ambulatory Visit (HOSPITAL_BASED_OUTPATIENT_CLINIC_OR_DEPARTMENT_OTHER): Payer: Medicaid Other | Admitting: Certified Registered Nurse Anesthetist

## 2022-05-24 ENCOUNTER — Encounter (HOSPITAL_COMMUNITY)
Admission: RE | Disposition: A | Discharge status: Home / Self Care | Payer: Self-pay | Source: Home / Self Care | Attending: Orthopedic Surgery

## 2022-05-24 ENCOUNTER — Encounter (HOSPITAL_COMMUNITY): Payer: Self-pay | Admitting: Orthopedic Surgery

## 2022-05-24 ENCOUNTER — Ambulatory Visit (HOSPITAL_COMMUNITY)
Admission: RE | Admit: 2022-05-24 | Discharge: 2022-05-24 | Disposition: A | Discharge status: Home / Self Care | Payer: Medicaid Other | Attending: Orthopedic Surgery | Admitting: Orthopedic Surgery

## 2022-05-24 DIAGNOSIS — S83511A Sprain of anterior cruciate ligament of right knee, initial encounter: Secondary | ICD-10-CM

## 2022-05-24 DIAGNOSIS — S83281A Other tear of lateral meniscus, current injury, right knee, initial encounter: Secondary | ICD-10-CM | POA: Diagnosis not present

## 2022-05-24 DIAGNOSIS — S83241A Other tear of medial meniscus, current injury, right knee, initial encounter: Secondary | ICD-10-CM | POA: Diagnosis not present

## 2022-05-24 DIAGNOSIS — M659 Synovitis and tenosynovitis, unspecified: Secondary | ICD-10-CM | POA: Insufficient documentation

## 2022-05-24 DIAGNOSIS — S83211A Bucket-handle tear of medial meniscus, current injury, right knee, initial encounter: Secondary | ICD-10-CM

## 2022-05-24 DIAGNOSIS — J45909 Unspecified asthma, uncomplicated: Secondary | ICD-10-CM | POA: Insufficient documentation

## 2022-05-24 DIAGNOSIS — K219 Gastro-esophageal reflux disease without esophagitis: Secondary | ICD-10-CM | POA: Insufficient documentation

## 2022-05-24 HISTORY — PX: KNEE ARTHROSCOPY WITH ANTERIOR CRUCIATE LIGAMENT (ACL) REPAIR WITH HAMSTRING GRAFT: SHX5645

## 2022-05-24 HISTORY — PX: MENISCUS REPAIR: SHX5179

## 2022-05-24 SURGERY — KNEE ARTHROSCOPY WITH ANTERIOR CRUCIATE LIGAMENT (ACL) REPAIR WITH HAMSTRING GRAFT
Anesthesia: General | Site: Knee | Laterality: Right

## 2022-05-24 MED ORDER — LACTATED RINGERS IV SOLN: INTRAVENOUS | Status: DC

## 2022-05-24 MED ORDER — OXYCODONE HCL 5 MG PO TABS
ORAL_TABLET | ORAL | Status: AC
  Filled 2022-05-24: qty 1

## 2022-05-24 MED ORDER — HYDROMORPHONE HCL 1 MG/ML IJ SOLN
INTRAMUSCULAR | Status: AC
  Administered 2022-05-24: 0.5 mg via INTRAVENOUS
  Filled 2022-05-24: qty 1

## 2022-05-24 MED ORDER — HYDROMORPHONE HCL 1 MG/ML IJ SOLN
0.5000 mg | INTRAMUSCULAR | Status: DC | PRN
  Administered 2022-05-24 (×2): 0.5 mg via INTRAVENOUS

## 2022-05-24 MED ORDER — HYDROMORPHONE HCL 2 MG/ML IJ SOLN
INTRAMUSCULAR | Status: AC
  Filled 2022-05-24: qty 1

## 2022-05-24 MED ORDER — SODIUM CHLORIDE 0.9 % IR SOLN
Status: DC | PRN
  Administered 2022-05-24: 6000 mL

## 2022-05-24 MED ORDER — ROPIVACAINE HCL 5 MG/ML IJ SOLN
INTRAMUSCULAR | Status: DC | PRN
  Administered 2022-05-24: 30 mL via PERINEURAL

## 2022-05-24 MED ORDER — OXYCODONE HCL 5 MG PO TABS
5.0000 mg | ORAL_TABLET | Freq: Once | ORAL | Status: AC | PRN
  Administered 2022-05-24: 5 mg via ORAL

## 2022-05-24 MED ORDER — PROPOFOL 10 MG/ML IV BOLUS
INTRAVENOUS | Status: AC
  Filled 2022-05-24: qty 20

## 2022-05-24 MED ORDER — HYDROMORPHONE HCL 1 MG/ML IJ SOLN
0.2500 mg | INTRAMUSCULAR | Status: DC | PRN
  Administered 2022-05-24 (×2): 0.5 mg via INTRAVENOUS

## 2022-05-24 MED ORDER — FENTANYL CITRATE (PF) 100 MCG/2ML IJ SOLN
INTRAMUSCULAR | Status: AC
  Filled 2022-05-24: qty 2

## 2022-05-24 MED ORDER — CEFAZOLIN SODIUM-DEXTROSE 2-4 GM/100ML-% IV SOLN
2.0000 g | INTRAVENOUS | Status: AC
  Administered 2022-05-24: 2 g via INTRAVENOUS
  Filled 2022-05-24: qty 100

## 2022-05-24 MED ORDER — AMISULPRIDE (ANTIEMETIC) 5 MG/2ML IV SOLN: 10.0000 mg | Freq: Once | INTRAVENOUS | Status: DC | PRN

## 2022-05-24 MED ORDER — FENTANYL CITRATE PF 50 MCG/ML IJ SOSY
PREFILLED_SYRINGE | INTRAMUSCULAR | Status: AC
  Filled 2022-05-24: qty 1

## 2022-05-24 MED ORDER — BUPIVACAINE HCL (PF) 0.25 % IJ SOLN
INTRAMUSCULAR | Status: AC
  Filled 2022-05-24: qty 30

## 2022-05-24 MED ORDER — ACETAMINOPHEN 10 MG/ML IV SOLN
INTRAVENOUS | Status: AC
  Filled 2022-05-24: qty 100

## 2022-05-24 MED ORDER — HYDROMORPHONE HCL 1 MG/ML IJ SOLN
INTRAMUSCULAR | Status: DC | PRN
  Administered 2022-05-24 (×5): .4 mg via INTRAVENOUS

## 2022-05-24 MED ORDER — LIDOCAINE 2% (20 MG/ML) 5 ML SYRINGE
INTRAMUSCULAR | Status: DC | PRN
  Administered 2022-05-24: 40 mg via INTRAVENOUS

## 2022-05-24 MED ORDER — ONDANSETRON HCL 4 MG/2ML IJ SOLN
INTRAMUSCULAR | Status: AC
  Filled 2022-05-24: qty 2

## 2022-05-24 MED ORDER — KETOROLAC TROMETHAMINE 15 MG/ML IJ SOLN
15.0000 mg | Freq: Once | INTRAMUSCULAR | Status: AC
  Administered 2022-05-24: 15 mg via INTRAVENOUS

## 2022-05-24 MED ORDER — MIDAZOLAM HCL 2 MG/2ML IJ SOLN
INTRAMUSCULAR | Status: AC
  Filled 2022-05-24: qty 2

## 2022-05-24 MED ORDER — ONDANSETRON HCL 4 MG/2ML IJ SOLN
INTRAMUSCULAR | Status: DC | PRN
  Administered 2022-05-24: 4 mg via INTRAVENOUS

## 2022-05-24 MED ORDER — DEXAMETHASONE SODIUM PHOSPHATE 10 MG/ML IJ SOLN
INTRAMUSCULAR | Status: AC
  Filled 2022-05-24: qty 1

## 2022-05-24 MED ORDER — CHLORHEXIDINE GLUCONATE 0.12 % MT SOLN
15.0000 mL | Freq: Once | OROMUCOSAL | Status: AC
  Administered 2022-05-24: 15 mL via OROMUCOSAL

## 2022-05-24 MED ORDER — MIDAZOLAM HCL 5 MG/5ML IJ SOLN
INTRAMUSCULAR | Status: DC | PRN
  Administered 2022-05-24: 1 mg via INTRAVENOUS

## 2022-05-24 MED ORDER — MIDAZOLAM HCL 2 MG/2ML IJ SOLN
1.0000 mg | INTRAMUSCULAR | Status: DC
  Administered 2022-05-24: 2 mg via INTRAVENOUS
  Filled 2022-05-24: qty 2

## 2022-05-24 MED ORDER — MEPERIDINE HCL 50 MG/ML IJ SOLN: 6.2500 mg | INTRAMUSCULAR | Status: DC | PRN

## 2022-05-24 MED ORDER — ONDANSETRON HCL 4 MG PO TABS: 4.0000 mg | ORAL_TABLET | Freq: Three times a day (TID) | ORAL | 0 refills | Status: DC | PRN

## 2022-05-24 MED ORDER — FENTANYL CITRATE PF 50 MCG/ML IJ SOSY
50.0000 ug | PREFILLED_SYRINGE | INTRAMUSCULAR | Status: DC
  Administered 2022-05-24: 100 ug via INTRAVENOUS
  Filled 2022-05-24: qty 2

## 2022-05-24 MED ORDER — ORAL CARE MOUTH RINSE: 15.0000 mL | Freq: Once | OROMUCOSAL | Status: AC

## 2022-05-24 MED ORDER — PROMETHAZINE HCL 25 MG/ML IJ SOLN: 6.2500 mg | INTRAMUSCULAR | Status: DC | PRN

## 2022-05-24 MED ORDER — OXYCODONE HCL 5 MG PO TABS: 5.0000 mg | ORAL_TABLET | ORAL | 0 refills | Status: DC | PRN

## 2022-05-24 MED ORDER — ACETAMINOPHEN 10 MG/ML IV SOLN
INTRAVENOUS | Status: DC | PRN
  Administered 2022-05-24: 1000 mg via INTRAVENOUS

## 2022-05-24 MED ORDER — EPINEPHRINE PF 1 MG/ML IJ SOLN
INTRAMUSCULAR | Status: AC
  Filled 2022-05-24: qty 2

## 2022-05-24 MED ORDER — HYDROMORPHONE HCL 1 MG/ML IJ SOLN
INTRAMUSCULAR | Status: AC
  Filled 2022-05-24: qty 1

## 2022-05-24 MED ORDER — PROPOFOL 10 MG/ML IV BOLUS
INTRAVENOUS | Status: DC | PRN
  Administered 2022-05-24: 200 mg via INTRAVENOUS
  Administered 2022-05-24: 20 mg via INTRAVENOUS

## 2022-05-24 MED ORDER — FENTANYL CITRATE (PF) 100 MCG/2ML IJ SOLN
INTRAMUSCULAR | Status: DC | PRN
  Administered 2022-05-24 (×6): 50 ug via INTRAVENOUS

## 2022-05-24 MED ORDER — OXYCODONE HCL 5 MG/5ML PO SOLN: 5.0000 mg | Freq: Once | ORAL | Status: AC | PRN

## 2022-05-24 MED ORDER — DEXAMETHASONE SODIUM PHOSPHATE 10 MG/ML IJ SOLN
INTRAMUSCULAR | Status: DC | PRN
  Administered 2022-05-24: 8 mg via INTRAVENOUS

## 2022-05-24 SURGICAL SUPPLY — 77 items
ANCH SUT 2-0 CVD FBRSTCH PLSTR (Anchor) ×2 IMPLANT
ANCH SUT 24D 2-0 CVD FBRSTCH (Anchor) ×1 IMPLANT
ANCHOR BUTTON TIGHTROPE 14 (Anchor) IMPLANT
ANCHOR TIGHTROPE II 20 (Anchor) IMPLANT
BAG COUNTER SPONGE SURGICOUNT (BAG) ×1 IMPLANT
BAG SPNG CNTER NS LX DISP (BAG) ×1
BANDAGE ESMARK 6X9 LF (GAUZE/BANDAGES/DRESSINGS) IMPLANT
BLADE SURG 15 STRL LF DISP TIS (BLADE) ×1 IMPLANT
BLADE SURG 15 STRL SS (BLADE) ×1
BLADE SURG SZ10 CARB STEEL (BLADE) ×1 IMPLANT
BNDG CMPR 9X6 STRL LF SNTH (GAUZE/BANDAGES/DRESSINGS)
BNDG ELASTIC 6X5.8 VLCR STR LF (GAUZE/BANDAGES/DRESSINGS) ×1 IMPLANT
BNDG ESMARK 6X9 LF (GAUZE/BANDAGES/DRESSINGS)
BUR OVAL CARBIDE 4.0 (BURR) IMPLANT
BURR OVAL 8 FLU 4.0X13 (MISCELLANEOUS) IMPLANT
BURR OVAL 8 FLU 5.0X13 (MISCELLANEOUS) IMPLANT
COVER BACK TABLE 60X90IN (DRAPES) ×1 IMPLANT
CUFF TOURN SGL QUICK 34 (TOURNIQUET CUFF) ×1
CUFF TRNQT CYL 34X4.125X (TOURNIQUET CUFF) ×1 IMPLANT
CUTTER BONE 4.0MM X 13CM (MISCELLANEOUS) ×1 IMPLANT
CUTTER TENSIONER SUT 2-0 0 FBW (INSTRUMENTS) IMPLANT
DRAPE ARTHROSCOPY W/POUCH 114 (DRAPES) ×1 IMPLANT
DRAPE INCISE IOBAN 66X45 STRL (DRAPES) IMPLANT
DRAPE SHEET LG 3/4 BI-LAMINATE (DRAPES) IMPLANT
DRAPE U-SHAPE 47X51 STRL (DRAPES) ×1 IMPLANT
DRILL FLIPCUTTER III TGHTRP RT (Anchor) IMPLANT
DURAPREP 26ML APPLICATOR (WOUND CARE) ×1 IMPLANT
ELECT REM PT RETURN 15FT ADLT (MISCELLANEOUS) ×1 IMPLANT
FIBERSTICK 2 (SUTURE) IMPLANT
FLIPCUTTER III TIGHTROPE RT (Anchor) ×1 IMPLANT
GAUZE PAD ABD 8X10 STRL (GAUZE/BANDAGES/DRESSINGS) ×1 IMPLANT
GAUZE SPONGE 4X4 12PLY STRL (GAUZE/BANDAGES/DRESSINGS) ×1 IMPLANT
GAUZE XEROFORM 1X8 LF (GAUZE/BANDAGES/DRESSINGS) ×1 IMPLANT
GLOVE BIO SURGEON STRL SZ7.5 (GLOVE) ×2 IMPLANT
GLOVE INDICATOR 8.0 STRL GRN (GLOVE) ×2 IMPLANT
IMMOBILIZER KNEE 20 (SOFTGOODS) ×1
IMMOBILIZER KNEE 20 THIGH 36 (SOFTGOODS) IMPLANT
IMP SYS 2ND FIX PEEK 4.75X19.1 (Miscellaneous) ×1 IMPLANT
IMPL FIBERSTICH 2-0 CVD (Anchor) IMPLANT
IMPL FIBERSTITCH 2-0 CVD 24DEG (Anchor) IMPLANT
IMPL SYS 2ND FX PEEK 4.75X19.1 (Miscellaneous) IMPLANT
IMPLANT FIBERSTICH 2-0 CVD (Anchor) ×2 IMPLANT
IV NS IRRIG 3000ML ARTHROMATIC (IV SOLUTION) ×4 IMPLANT
KIT BASIN OR (CUSTOM PROCEDURE TRAY) ×1 IMPLANT
KIT TURNOVER KIT A (KITS) IMPLANT
MANIFOLD NEPTUNE II (INSTRUMENTS) ×1 IMPLANT
NEEDLE HYPO 22GX1.5 SAFETY (NEEDLE) IMPLANT
PACK ARTHROSCOPY DSU (CUSTOM PROCEDURE TRAY) ×1 IMPLANT
PAD ARMBOARD 7.5X6 YLW CONV (MISCELLANEOUS) IMPLANT
PADDING CAST COTTON 6X4 STRL (CAST SUPPLIES) IMPLANT
PENCIL SMOKE EVACUATOR (MISCELLANEOUS) IMPLANT
PORT APPOLLO RF 90DEGREE MULTI (SURGICAL WAND) IMPLANT
PORTAL SKID DEVICE (INSTRUMENTS) IMPLANT
PROBE HOOK APOLLO (SURGICAL WAND) IMPLANT
SPONGE T-LAP 4X18 ~~LOC~~+RFID (SPONGE) ×1 IMPLANT
STRIP CLOSURE SKIN 1/2X4 (GAUZE/BANDAGES/DRESSINGS) ×1 IMPLANT
SUCTION FRAZIER HANDLE 10FR (MISCELLANEOUS) ×1
SUCTION TUBE FRAZIER 10FR DISP (MISCELLANEOUS) ×1 IMPLANT
SUT 2 FIBERLOOP 20 STRT BLUE (SUTURE)
SUT FIBERWIRE #2 38 REV NDL BL (SUTURE)
SUT FIBERWIRE #2 38 T-5 BLUE (SUTURE)
SUT MNCRL AB 3-0 PS2 18 (SUTURE) ×1 IMPLANT
SUT VIC AB 0 CT2 27 (SUTURE) IMPLANT
SUT VIC AB 2-0 CT2 27 (SUTURE) ×1 IMPLANT
SUT VICRYL 0 UR6 27IN ABS (SUTURE) ×1 IMPLANT
SUTURE 2 FIBERLOOP 20 STRT BLU (SUTURE) IMPLANT
SUTURE FIBERWR #2 38 T-5 BLUE (SUTURE) IMPLANT
SUTURE FIBERWR#2 38 REV NDL BL (SUTURE) IMPLANT
SUTURE TIGERSTICK 2 TIGERWIR 2 (MISCELLANEOUS) IMPLANT
SYR CONTROL 10ML LL (SYRINGE) ×1 IMPLANT
TIGERSTICK 2 TIGERWIRE 2 (MISCELLANEOUS)
TOWEL OR 17X26 10 PK STRL BLUE (TOWEL DISPOSABLE) ×1 IMPLANT
TUBING ARTHROSCOPY IRRIG 16FT (MISCELLANEOUS) ×1 IMPLANT
TUBING CONNECTING 10 (TUBING) ×1 IMPLANT
WAND APOLLORF SJ50 AR-9845 (SURGICAL WAND) ×1 IMPLANT
WATER STERILE IRR 500ML POUR (IV SOLUTION) ×1 IMPLANT
WRAP KNEE MAXI GEL POST OP (GAUZE/BANDAGES/DRESSINGS) IMPLANT

## 2022-05-24 NOTE — Discharge Instructions (Signed)
DISCHARGE INSTRUCTIONS: ________________________________________________________________________________ ACL RECONSTRUCTION HOME EXERCISE PROGRAM (0-2 WEEKS)   Elevate the leg above your heart as often as possible. Weight bear as tolerated with the immobilizer.  Use crutches as needed, progress from 2 to 1 crutch as able using one crutch on opposite side of surgical knee.  Do not limp and do not walk too much!! Wear immobilizer all the time except when exercising.  Wear immobilizer at night!! Start normal showering 3 days after surgery.  Do NOT submerge under water. After you remove bandages for the shower you can apply an ace bandage back to the knee.  Leave the steri-strips in place until follow up. Goals for first two weeks:  minimal swelling, motion 0-90, walking with immobilizer without crutches and positive attitude about PT. Use pain medication as needed.  To prevent constipation use Colace 100mg . twice a day while on pain medication.  If constipated, use Miralax 17 gm once a day and drink plenty of fluids.  These medications can be obtained at the pharmacy without a prescription.   Follow up in the office in 14 days. Leave dressing in place and reinforce as needed.  For the prevention of DVT, take an 81 mg aspirin daily for 6 weeks.

## 2022-05-24 NOTE — Progress Notes (Signed)
Orthopedic Tech Progress Note Patient Details:  Jerome Lam 1996-06-21 [id number]  Ortho Devices Type of Ortho Device: Crutches Ortho Device/Splint Interventions: Application, Adjustment   Post Interventions Patient Tolerated: Well Instructions Provided: Care of device, Adjustment of device  [name] 05/24/2022, 5:54 PM

## 2022-05-24 NOTE — Anesthesia Preprocedure Evaluation (Signed)
Anesthesia Evaluation  Patient identified by MRN, date of birth, ID band Patient awake    Reviewed: Allergy & Precautions, NPO status , Patient's Chart, lab work & pertinent test results  Airway Mallampati: II  TM Distance: >3 FB Neck ROM: Full    Dental no notable dental hx.    Pulmonary asthma , Patient abstained from smoking.,    Pulmonary exam normal breath sounds clear to auscultation       Cardiovascular negative cardio ROS Normal cardiovascular exam Rhythm:Regular Rate:Normal     Neuro/Psych Depression negative neurological ROS  negative psych ROS   GI/Hepatic Neg liver ROS, GERD  ,  Endo/Other  negative endocrine ROS  Renal/GU negative Renal ROS  negative genitourinary   Musculoskeletal negative musculoskeletal ROS (+)   Abdominal   Peds negative pediatric ROS (+)  Hematology negative hematology ROS (+)   Anesthesia Other Findings   Reproductive/Obstetrics negative OB ROS                             Anesthesia Physical Anesthesia Plan  ASA: 2  Anesthesia Plan: General   Post-op Pain Management: Regional block*, Dilaudid IV and Ofirmev IV (intra-op)*   Induction: Intravenous  PONV Risk Score and Plan: 2 and Ondansetron, Midazolam and Treatment may vary due to age or medical condition  Airway Management Planned: LMA  Additional Equipment:   Intra-op Plan:   Post-operative Plan: Extubation in OR  Informed Consent: I have reviewed the patients History and Physical, chart, labs and discussed the procedure including the risks, benefits and alternatives for the proposed anesthesia with the patient or authorized representative who has indicated his/her understanding and acceptance.     Dental advisory given  Plan Discussed with: CRNA  Anesthesia Plan Comments:         Anesthesia Quick Evaluation

## 2022-05-24 NOTE — Brief Op Note (Signed)
05/24/2022  2:49 PM  PATIENT:  Jerome Lam  26 y.o. male  PRE-OPERATIVE DIAGNOSIS:  Right knee anterior cruciate ligament tear, medial meniscus bucket tear  POST-OPERATIVE DIAGNOSIS:  Right knee anterior cruciate ligament tear, medial meniscus bucket tear  PROCEDURE:  Procedure(s) with comments: KNEE ARTHROSCOPY WITH ANTERIOR CRUCIATE LIGAMENT (ACL) RECONSTRUCTION WITH HAMSTRING GRAFT, PARTIAL LATERAL MENISCECTOMY (Right) - 120 REPAIR OF MEDIAL MENISCUS (Right) - 120  SURGEON:  Surgeon(s) and Role:    * Stann Mainland, Elly Modena, MD - Primary  PHYSICIAN ASSISTANT: Jonelle Sidle, PA-C  ANESTHESIA:   regional and general  EBL: 20 cc  BLOOD ADMINISTERED:none  DRAINS: none   LOCAL MEDICATIONS USED:  NONE  SPECIMEN:  No Specimen  DISPOSITION OF SPECIMEN:  N/A  COUNTS:  YES  TOURNIQUET:  * Missing tourniquet times found for documented tourniquets in log: [id number] *  DICTATION: .Note written in EPIC  PLAN OF CARE: Discharge to home after PACU  PATIENT DISPOSITION:  PACU - hemodynamically stable.   Delay start of Pharmacological VTE agent (>24hrs) due to surgical blood loss or risk of bleeding: not applicable

## 2022-05-24 NOTE — Transfer of Care (Signed)
Immediate Anesthesia Transfer of Care Note  Patient: CONARD ALVIRA  Procedure(s) Performed: KNEE ARTHROSCOPY WITH ANTERIOR CRUCIATE LIGAMENT (ACL) RECONSTRUCTION WITH HAMSTRING GRAFT, PARTIAL LATERAL MENISCECTOMY (Right: Knee) REPAIR OF MEDIAL MENISCUS (Right: Knee)  Patient Location: PACU  Anesthesia Type:GA combined with regional for post-op pain  Level of Consciousness: awake, alert  and patient cooperative  Airway & Oxygen Therapy: Patient Spontanous Breathing and Patient connected to nasal cannula oxygen  Post-op Assessment: Report given to RN and Post -op Vital signs reviewed and stable  Post vital signs: Reviewed and stable  Last Vitals:  Vitals Value Taken Time  BP 134/101 05/24/22 1520  Temp    Pulse 79 05/24/22 1522  Resp 23 05/24/22 1522  SpO2 99 % 05/24/22 1522  Vitals shown include unvalidated device data.  Last Pain:  Vitals:   05/24/22 1047  TempSrc:   PainSc: 9       Patients Stated Pain Goal: 8 (41/74/08 1448)  Complications: No notable events documented.

## 2022-05-24 NOTE — Op Note (Signed)
Surgery Date: 05/24/2022    Surgeon(s): Nicholes Stairs, MD   ASSIST: Jonelle Sidle, PA-C  Assistant attestation:  PA Thereasa Solo was present for the entire procedure.  Implants:  Arthrex all inside cortical buttons on femur and tibia 4.75 PEEK swivel lock x 1. Arthrex fiber stitch meniscus repair device x3   ANESTHESIA:  general, and adductor block   IV FLUIDS AND URINE: See anesthesia.   TOURNIQUET: 300 mmHg for 75 minutes   DRAINS: none   COMPLICATIONS: None.    ESTIMATED BLOOD LOSS: minimal   PREOPERATIVE DIAGNOSES:  1.  Right knee medial bucket-handle meniscus tear 2.  Right knee complete ACL rupture   POSTOPERATIVE DIAGNOSES:  1.  Right knee medial bucket-handle meniscus tear 2.  Right knee complete ACL rupture 3.  Right knee lateral meniscus tear, free edge, initial encounter   PROCEDURES PERFORMED:  1.  Right knee arthroscopy with Hamstring autograft ACL reconstruction 2.  Arthroscopic partial lateral meniscectomy right knee 3.  Arthroscopic right knee medial meniscus repair    DESCRIPTION OF PROCEDURE:  Jerome Lam is a right knee acute anterior cruciate ligament disruption with medial meniscus tear, partial MCL tear, possible lateral meniscus tear.  They sustained these injuries about 2 months prior to coming to the operating room today.  After a short period of prehabilitation to allow for return of ROM and quadriceps strenght, we discussed proceeding with arthroscopically assisted hamstring autograft ACL reconstruction and medial meniscectomy versus repair.  We reviewed the risks benefits and indications of this procedure including but not limited to bleeding, infection, damage to neurovascular structures, need for future surgery, developed an of arthrosis, rupture of graft, continued instability of the knee, and developement of blood clots and risk of anesthesia.  All questions answered.   The patient was identified in the preoperative holding area  and the operative extremity was marked. The patient was brought to the operating room and transferred to operating table in a supine position. Satisfactory general anesthesia was induced by anesthesiology.     Examination under anesthesia revealed a grade 2B Lachman, grade 1 pivot shift, and stable to varus and valgus stress.    The procedure was initiated by obtaining a hamstring graft. An Esmarch was used to wrap out the leg and the tourniquet was raised for a short time. A 3-inch incision was created over the pes anserine. Dissection was carried out to the level of the sartorius, which was divided above the gracilis tendon, then everted to reveal the semitendinosus tendon which was released distally, tagged and stripped per usual.   The sartorius and gracilis were then repaired back to their insertion with heavy Vicryl suture, that was later incorporated into the swivel lock anchor.   At the back table, we next prepared the graft by removing muscle and tenosynovium. The semitendinosis graft were was cut to length and quadrupled, looping around the Arthrex ACL TightRope for the femoral side and ABS tightrope for the tibial side. The two free ends of the autograft were secured to each other at the tibial side with #2 interlocking FiberWire sutures.  The remainder of the graft was then circumferentially secured according to the manufacturer's recommendations with a 0 FiberWire suture twice at the tibial side and once at the femoral side. The graft was pretensioned on the back table and wrapped in a saline-soaked gauze.  The graft measured 64 mm in total length, 9.5 mm on femoral tunnel diameter, and 9.0 mm diameter on the tibial tunnel.  Standard anterolateral, anteromedial arthroscopy portals were obtained. The anteromedial portal was obtained with a spinal needle for localization under direct visualization with subsequent diagnostic findings.    Anteromedial and anterolateral chambers: mild synovitis.  The synovitis was debrided with a 4.5 mm full radius shaver through both the anteromedial and lateral portals.    Suprapatellar pouch and gutters: no synovitis or debris. Patella chondral surface: Grade 0 Trochlear chondral surface: Grade 0 Patellofemoral tracking: Midline, no tilt Medial meniscus: Red zone bucket-handle tear involving the posterior horn and mid body Medial femoral condyle flexion bearing surface: Grade 0 Medial femoral condyle extension bearing surface: Grade 0 Medial tibial plateau: Grade 0 Anterior cruciate ligament:Complete mid substance tear Posterior cruciate ligament:stable Lateral meniscus: White zone mid body horn radial tear..   Lateral femoral condyle flexion bearing surface: Grade 0 Lateral femoral condyle extension bearing surface: Grade 0 Lateral tibial plateau: Grade 1   Next, We turned our attention to the medial meniscus tear.  Due to the size of the tear and being a red zone tear elected to proceed with repair.  We utilized all inside repair devices with the Arthrex meniscal fiber stitch.  We biologically prepared the tear by utilizing the motorized shaver and between the torn tissue and the capsular side.  We then trephinated with spinal needle throughout.  Next, we reduced with the probe.  We then placed at the apex of the tear a horizontal mattress with the meniscal cinch.  We then placed 2 subsequent vertical mattresses moving more towards the posterior horn.  We then probed the tear and found it to be very nicely adhered to the capsular tissue.   The lateral meniscus was inspected closely and found to have a white zone radial tear of the mid body.  This did not complete through the entire meniscus and did not propagate to the capsule attachment.  There was a unstable piece of meniscal tissue that was debrided back with the motorized shaver until it was stable.  This completed the lateral partial meniscectomy.  We did also perform a chondroplasty of the  lateral tibial plateau underneath this tear.  This was performed utilizing the motorized shaver as well.   Next, the ACL reconstruction was undertaken. The ACL stump was removed with thermal ablation and shaver and anatomic bony landmarks were marked for the placement of the femoral and tibial sockets.  Arthrex retroguides and Flipcutters were used to create the sockets and perform the procedure by an all-inside GraftLink technique.  The femoral socket was created at the inferior portion of the bifurcate ridge of the lateral femoral wall with a size 9.5 mm FlipCutter to a depth of  15 mm while the tibial socket was created at the center of the ACL footprint from front to back and toward the base of the medial tibial eminence from medial to lateral, to a depth of 23-25 mm with a 9.0 mm FlipCutter. Bony debris was removed and the edges of socket apertures were smoothed. Suture shuttles were used to deliver the graft into the femoral socket first and the tibial socket second. The graft was then secured within the sockets, cinching the self-locking sutures overtop of the proximal and distal cortical buttons with the knee in a reduced position maintained at 20 degrees flexion while a moderate force posterior drawer was applied.  After this preliminary tensioning, the knee was placed through several flexion-extension cycles to eliminate any graft settling or excursion and the graft was re-tensioned in the same manner and  the sutures were tied over top of the buttons proximally and distally, and the four tibial sided suture arms were secondarily secured at the proximal tibia with 1 SwiveLock anchor. Of note we did also pass a free labral tape through the ACL fixation as an separate internal brace backup fixation.   Final images of the ACL graft were obtained, revealing no lateral wall or roof impingement of the graft at the notch through range of motion.  Stability of the ACL graft was assessed and found to be normalized  at grade 0 lachman and grade 0 pivot shift.    The wounds were all closed in layers per usual.  Dressings were applied and a brace placed And locked in 0 of flexion..  There were no apparent complications.  The patient was awakened and taken to recovery room in satisfactory condition.   POSTOPERATIVE PLAN:  Jerome Lam will be touch down weight bearing on crutches until cleared by The therapist.  They will likewise be in The knee brace with it locked until quad function is normalized. They will be on 81 mg asa daily for 1 month for DVT PPX.  they will return to the clinic to see the surgeon in 2 weeks.   Yolonda Kida

## 2022-05-24 NOTE — H&P (Signed)
[facility]  REQUESTING PHYSICIAN: Nicholes Stairs, MD  PCP:  Harrie Jeans, MD  Chief Complaint: Right knee ACL tear  HPI: Jerome Lam is a 26 y.o. male who complains of right knee pain including swelling and instability.  Here today for surgical ACL reconstruction and possible meniscus work.  No new complaints today.  Past Medical History:  Diagnosis Date   Asthma    Attention deficit hyperactivity disorder (ADHD)    Depression    Vomiting    Since December   Past Surgical History:  Procedure Laterality Date   NO PAST SURGERIES     Social History   Socioeconomic History   Marital status: Single    Spouse name: Not on file   Number of children: Not on file   Years of education: Not on file   Highest education level: Not on file  Occupational History   Not on file  Tobacco Use   Smoking status: Never   Smokeless tobacco: Never  Vaping Use   Vaping Use: Never used  Substance and Sexual Activity   Alcohol use: Yes    Comment: occasionally   Drug use: Yes    Frequency: 2.0 times per week    Types: Marijuana   Sexual activity: Not on file  Other Topics Concern   Not on file  Social History Narrative   9th grade (middle college)   Social Determinants of Health   Financial Resource Strain: Not on file  Food Insecurity: Not on file  Transportation Needs: Not on file  Physical Activity: Not on file  Stress: Not on file  Social Connections: Not on file   Family History  Problem Relation Age of Onset   Celiac disease Neg Hx    Ulcers Neg Hx    Cholelithiasis Neg Hx    Allergies  Allergen Reactions   Bee Venom Swelling   Prior to Admission medications   Medication Sig Start Date End Date Taking? Authorizing Provider  albuterol (PROVENTIL HFA;VENTOLIN HFA) 108 (90 BASE) MCG/ACT inhaler Inhale 2 puffs into the lungs every 6 (six) hours as needed for wheezing or shortness of breath.   Yes [provider]  budesonide-formoterol  (SYMBICORT) 160-4.5 MCG/ACT inhaler Inhale 2 puffs into the lungs 2 (two) times daily as needed (shortness of breath).   Yes [provider]  ibuprofen (ADVIL) 200 MG tablet Take 600 mg by mouth every 6 (six) hours as needed for moderate pain.   Yes [provider]  levocetirizine (XYZAL) 5 MG tablet Take 5 mg by mouth every morning.   Yes [provider]  montelukast (SINGULAIR) 10 MG tablet Take 10 mg by mouth every morning.    Yes [provider]  cephALEXin (KEFLEX) 500 MG capsule Take 2 capsules (1,000 mg total) by mouth 2 (two) times daily. Patient not taking: Reported on 05/14/2022 09/16/20   Elnora Morrison, MD  doxycycline (VIBRAMYCIN) 100 MG capsule Take 1 capsule (100 mg total) by mouth 2 (two) times daily. Patient not taking: Reported on 05/14/2022 03/06/20   Ezequiel Essex, MD  famotidine (PEPCID) 20 MG tablet Take 1 tablet (20 mg total) by mouth 2 (two) times daily. Patient not taking: Reported on 05/14/2022 11/24/17   Evalee Jefferson, PA-C  ibuprofen (ADVIL) 600 MG tablet Take 1 tablet (600 mg total) by mouth every 6 (six) hours as needed. Patient not taking: Reported on 05/14/2022 09/16/20   Elnora Morrison, MD  ibuprofen (ADVIL,MOTRIN) 600 MG tablet Take 1 tablet (  600 mg total) by mouth 4 (four) times daily. Patient not taking: Reported on 05/14/2022 10/08/16   Lily Kocher, PA-C  methocarbamol (ROBAXIN) 500 MG tablet Take 1 tablet (500 mg total) by mouth 3 (three) times daily. Patient not taking: Reported on 05/14/2022 10/08/16   Lily Kocher, PA-C  ondansetron (ZOFRAN ODT) 4 MG disintegrating tablet Take 1 tablet (4 mg total) by mouth every 8 (eight) hours as needed for nausea or vomiting. Patient not taking: Reported on 05/14/2022 11/24/17   Evalee Jefferson, PA-C   No results found.  Positive ROS: All other systems have been reviewed and were otherwise negative with the exception of those mentioned in the HPI and as above.  Physical Exam: General:  Alert, no acute distress Cardiovascular: No pedal edema Respiratory: No cyanosis, no use of accessory musculature GI: No organomegaly, abdomen is soft and non-tender Skin: No lesions in the area of chief complaint Neurologic: Sensation intact distally Psychiatric: Patient is competent for consent with normal mood and affect Lymphatic: No axillary or cervical lymphadenopathy  MUSCULOSKELETAL: Right lower extremity is warm well perfused with no open wounds or lesions.  Assessment: 1.  Right knee anterior crucial ligament disruption  2.  Right knee medial meniscus tear, acute, initial encounter  Plan: Plan to proceed today with arthroscopic assisted ACL reconstruction with hamstring autograft.  Also will inspect medial and lateral menisci and repair as indicated.  Again we reviewed the risk and benefits of the procedure including but not limited to bleeding, infection, damage to surrounding nerves and vessels, stiffness, failure of graft, progression of disease, need for further surgery, DVT, the risk of anesthesia.  He has provided informed consent.  For discharge home postop from PACU.    Nicholes Stairs, MD Cell [phone]    05/24/2022 12:51 PM

## 2022-05-24 NOTE — Anesthesia Procedure Notes (Addendum)
Procedure Name: LMA Insertion Date/Time: 05/24/2022 1:01 PM  Performed by: West Pugh, CRNAPre-anesthesia Checklist: Patient identified, Emergency Drugs available, Suction available, Patient being monitored and Timeout performed Patient Re-evaluated:Patient Re-evaluated prior to induction Oxygen Delivery Method: Circle system utilized Preoxygenation: Pre-oxygenation with 100% oxygen Induction Type: IV induction LMA: LMA with gastric port inserted LMA Size: 4.0 Number of attempts: 1 Placement Confirmation: positive ETCO2 Tube secured with: Tape Dental Injury: Teeth and Oropharynx as per pre-operative assessment

## 2022-05-24 NOTE — Anesthesia Procedure Notes (Signed)
Anesthesia Regional Block: Adductor canal block   Pre-Anesthetic Checklist: , timeout performed,  Correct Patient, Correct Site, Correct Laterality,  Correct Procedure, Correct Position, site marked,  Risks and benefits discussed,  Surgical consent,  Pre-op evaluation,  At surgeon's request and post-op pain management  Laterality: Right  Prep: chloraprep       Needles:  Injection technique: Single-shot  Needle Type: Stimiplex     Needle Length: 9cm  Needle Gauge: 21     Additional Needles:   Procedures:,,,, ultrasound used (permanent image in chart),,    Narrative:  Start time: 05/24/2022 12:12 PM End time: 05/24/2022 12:17 PM Injection made incrementally with aspirations every 5 mL.  Performed by: Personally  Anesthesiologist: Lynda Rainwater, MD

## 2022-05-25 ENCOUNTER — Encounter (HOSPITAL_COMMUNITY): Payer: Self-pay | Admitting: Orthopedic Surgery

## 2022-05-25 NOTE — Anesthesia Postprocedure Evaluation (Signed)
Anesthesia Post Note  Patient: Jerome Lam  Procedure(s) Performed: KNEE ARTHROSCOPY WITH ANTERIOR CRUCIATE LIGAMENT (ACL) RECONSTRUCTION WITH HAMSTRING GRAFT, PARTIAL LATERAL MENISCECTOMY (Right: Knee) REPAIR OF MEDIAL MENISCUS (Right: Knee)     Patient location during evaluation: PACU Anesthesia Type: General Level of consciousness: awake and alert Pain management: pain level controlled Vital Signs Assessment: post-procedure vital signs reviewed and stable Respiratory status: spontaneous breathing, nonlabored ventilation and respiratory function stable Cardiovascular status: blood pressure returned to baseline and stable Postop Assessment: no apparent nausea or vomiting Anesthetic complications: no   No notable events documented.  Last Vitals:  Vitals:   05/24/22 1716 05/24/22 1725  BP: (!) 144/79 (!) 148/83  Pulse: 73 80  Resp: (!) 24 20  Temp:    SpO2: 97% 98%    Last Pain:  Vitals:   05/24/22 1725  TempSrc:   PainSc: [place]

## 2022-05-26 ENCOUNTER — Encounter (HOSPITAL_COMMUNITY): Payer: Self-pay | Admitting: Orthopedic Surgery

## 2022-05-28 ENCOUNTER — Encounter (HOSPITAL_COMMUNITY): Payer: Medicaid Other

## 2022-05-31 DIAGNOSIS — M25561 Pain in right knee: Secondary | ICD-10-CM | POA: Diagnosis not present

## 2022-05-31 DIAGNOSIS — M25661 Stiffness of right knee, not elsewhere classified: Secondary | ICD-10-CM | POA: Diagnosis not present

## 2022-06-07 DIAGNOSIS — M25561 Pain in right knee: Secondary | ICD-10-CM | POA: Diagnosis not present

## 2022-06-07 DIAGNOSIS — M25661 Stiffness of right knee, not elsewhere classified: Secondary | ICD-10-CM | POA: Diagnosis not present

## 2022-07-06 DIAGNOSIS — Z4789 Encounter for other orthopedic aftercare: Secondary | ICD-10-CM | POA: Diagnosis not present

## 2022-07-29 NOTE — Therapy (Signed)
OUTPATIENT PHYSICAL THERAPY LOWER EXTREMITY EVALUATION   Patient Name: Jerome Lam MRN: [id number] DOB:19-Jun-1996, 26 y.o., male Today's Date: 07/30/2022  END OF SESSION:  PT End of Session - 07/30/22 0743     Visit Number 1    Number of Visits 8    Date for PT Re-Evaluation 08/27/22    Authorization Type [employer]    Authorization Time Period please check auth submitted at eval    PT Start Time 0735    PT Stop Time 0814    PT Time Calculation (min) 39 min    Activity Tolerance Patient tolerated treatment well    Behavior During Therapy [facility] for tasks assessed/performed             Past Medical History:  Diagnosis Date   Asthma    Attention deficit hyperactivity disorder (ADHD)    Depression    Vomiting    Since December   Past Surgical History:  Procedure Laterality Date   KNEE ARTHROSCOPY WITH ANTERIOR CRUCIATE LIGAMENT (ACL) REPAIR WITH HAMSTRING GRAFT Right 05/24/2022   Procedure: KNEE ARTHROSCOPY WITH ANTERIOR CRUCIATE LIGAMENT (ACL) RECONSTRUCTION WITH HAMSTRING GRAFT, PARTIAL LATERAL MENISCECTOMY;  Surgeon: Yolonda Kida, MD;  Location: [facility];  Service: Orthopedics;  Laterality: Right;  120   MENISCUS REPAIR Right 05/24/2022   Procedure: REPAIR OF MEDIAL MENISCUS;  Surgeon: Yolonda Kida, MD;  Location: [facility];  Service: Orthopedics;  Laterality: Right;  120   NO PAST SURGERIES     Patient Active Problem List   Diagnosis Date Noted   GERD (gastroesophageal reflux disease) 11/08/2012   Unspecified constipation 10/03/2012   Abdominal pain, other specified site 10/02/2012   Vomiting     PCP: has one but cannot recall  REFERRING PROVIDER: PT eval/tx for orthopedic aftercare s/p ACL reconstruction 05/24/2022 per Dr. Aundria Rud  Dion Saucier, MD  REFERRING DIAG: PT eval/tx for orthopedic aftercare s/p ACL reconstruction 05/24/2022 per  Dr. Aundria Rud / Dion Saucier, MD  THERAPY DIAG:  Stiffness of right knee, not elsewhere  classified  Right leg weakness  Rationale for Evaluation and Treatment: Rehabilitation  ONSET DATE: s/p 05/24/2022  SUBJECTIVE:   SUBJECTIVE STATEMENT: Playing soccer and came down awkwardly with contact with another player; pours concrete for work; still had knee pain; later felt a "pop"; and with continued pain.  Went to see Dr. Aundria Rud; MRI;  showed ACL and meniscus tearing.  He is still working.  Starting working 2 weeks out of surgery wearing a brace; had therapy with [facility] and then referred here due to having trouble getting  appointments; he will be 10 weeks out 08/02/2022  PERTINENT HISTORY: Left knee partially torn meniscus PAIN:  Are you having pain? Yes: NPRS scale: 2/10 Pain location: medial knee Pain description: sometimes with movement Aggravating factors: movement Relieving factors: rest   PRECAUTIONS: Other: per ACL protocol   WEIGHT BEARING RESTRICTIONS: No  FALLS:  Has patient fallen in last 6 months? No  LIVING ENVIRONMENT: Lives with: lives with their family Lives in: House/apartment Stairs: Yes: External: 3 steps; on right going up, on left going up, and bilateral but cannot reach both Has following equipment at home: Crutches  OCCUPATION: pour concrete  PLOF: Independent  PATIENT GOALS: get back to playing sports  NEXT MD VISIT: 3 more weeks  OBJECTIVE:   DIAGNOSTIC FINDINGS: MRI showed R ACL tears  PATIENT SURVEYS:  FOTO 67  COGNITION: Overall cognitive status: Within functional limits for tasks assessed  SENSATION: WFL  EDEMA:  Mild swelling noted at knee joint  PALPATION: Non tender; mild swelling noted around patella area  LOWER EXTREMITY ROM:  Active ROM Right eval Left eval  Hip flexion    Hip extension    Hip abduction    Hip adduction    Hip internal rotation    Hip external rotation    Knee flexion 132 145  Knee extension -5 0  Ankle dorsiflexion    Ankle plantarflexion    Ankle inversion    Ankle  eversion     (Blank rows = not tested)  LOWER EXTREMITY MMT:  MMT Right eval Left eval  Hip flexion    Hip extension    Hip abduction    Hip adduction    Hip internal rotation    Hip external rotation    Knee flexion 4 5  Knee extension Good quad set but decreased girth noted versus left 5  Ankle dorsiflexion    Ankle plantarflexion    Ankle inversion    Ankle eversion     (Blank rows = not tested)  FUNCTIONAL TESTS:  2 minute walk test: please test next visit  GAIT: Distance walked: 50 ft Assistive device utilized: None Level of assistance: Modified independence Comments: slight decreased gait speed; wearing soft brace right knee   TODAY'S TREATMENT:                                                                                                                              DATE: physical therapy evaluation and HEP instruction    PATIENT EDUCATION:  Education details: Patient educated on exam findings, POC, scope of PT, HEP, and discussion of protocol. Person educated: Patient Education method: Explanation, Demonstration, and Handouts Education comprehension: verbalized understanding, returned demonstration, verbal cues required, and tactile cues required  HOME EXERCISE PROGRAM: Access Code: [email or web address] URL: [email or web address] Date: 07/30/2022 Prepared by: [facility] - Rehab   Exercises - Prone Knee Extension with Ankle Weight  - 3 x daily - 7 x weekly - 1 sets - 1 reps - 1-5 min hold - Prone Quadriceps Stretch with Strap  - 3 x daily - 7 x weekly - 1 sets - 10 reps - Supine Heel Slide  - 3 x daily - 7 x weekly - 1 sets - 10 reps - Supine Hamstring Stretch with Strap  - 1 x daily - 7 x weekly - 1 sets - 5 reps - 20" hold  ASSESSMENT:  CLINICAL IMPRESSION: Patient is a 26 y.o. male who was seen today for physical therapy evaluation and treatment for s/p  Right ACL reconstruction per Dr. Aundria Rud. Patient demonstrates muscle weakness, reduced ROM, and  fascial restrictions which are likely contributing to symptoms of pain and are negatively impacting patient ability to perform ADLs and functional mobility tasks. Patient will benefit from skilled physical therapy services to address these deficits to reduce pain and improve level of function with ADLs and functional  mobility tasks.   OBJECTIVE IMPAIRMENTS: Abnormal gait, decreased activity tolerance, decreased balance, decreased endurance, decreased mobility, difficulty walking, decreased ROM, decreased strength, hypomobility, increased edema, increased fascial restrictions, impaired perceived functional ability, increased muscle spasms, impaired flexibility, impaired tone, and pain.   ACTIVITY LIMITATIONS: carrying, lifting, bending, standing, squatting, stairs, and locomotion level  PARTICIPATION LIMITATIONS: community activity, occupation, and recreational sports participation  REHAB POTENTIAL: Excellent  CLINICAL DECISION MAKING: Stable/uncomplicated  EVALUATION COMPLEXITY: Low   GOALS: Goals reviewed with patient? No  SHORT TERM GOALS: Target date: 08/13/2021 patient will be independent with initial HEP Baseline: Goal status: INITIAL  2.  Patient will have full right knee AROM 0-140 to promote full range with squatting and kneeling for work activities Baseline: see above Goal status: INITIAL    LONG TERM GOALS: Target date: 08/27/2021  Patient will be independent in self management strategies to improve quality of life and functional outcomes.   Baseline:  Goal status: INITIAL  2.  Patient will improve FOTO score to predicted value   Baseline: 67 Goal status: INITIAL  3.  Patient will increase  leg MMTs to 5/5 without pain to promote return to ambulation community distances with minimal deviation.  Baseline:  Goal status: INITIAL  4.  Patient will demonstrate right single leg press  1 rep max at 70% or greater of left single leg press 1 rep max Baseline:  Goal  status: INITIAL   PLAN:  PT FREQUENCY: 2x/week  PT DURATION: 4 weeks  PLANNED INTERVENTIONS: Therapeutic exercises, Therapeutic activity, Neuromuscular re-education, Balance training, Gait training, Patient/Family education, Joint manipulation, Joint mobilization, Stair training, Orthotic/Fit training, DME instructions, Aquatic Therapy, Dry Needling, Electrical stimulation, Spinal manipulation, Spinal mobilization, Cryotherapy, Moist heat, Compression bandaging, scar mobilization, Splintting, Taping, Traction, Ultrasound, Ionotophoresis 4mg /ml Dexamethasone, and Manual therapy   PLAN FOR NEXT SESSION: Review HEP and goals; protocol on previous treating therapist desk; progress per protocol   9:18 AM, 07/30/22 Yaqueline Gutter Small Harvey Lingo MPT [place] [facility] [place] [phone] Ph:[phone]

## 2022-07-30 ENCOUNTER — Ambulatory Visit (HOSPITAL_COMMUNITY): Payer: Medicaid Other | Attending: Physician Assistant

## 2022-07-30 DIAGNOSIS — M25661 Stiffness of right knee, not elsewhere classified: Secondary | ICD-10-CM | POA: Insufficient documentation

## 2022-07-30 DIAGNOSIS — R29898 Other symptoms and signs involving the musculoskeletal system: Secondary | ICD-10-CM | POA: Diagnosis not present

## 2022-08-03 ENCOUNTER — Telehealth (HOSPITAL_COMMUNITY): Payer: Self-pay

## 2022-08-03 ENCOUNTER — Encounter (HOSPITAL_COMMUNITY): Payer: Medicaid Other

## 2022-08-03 NOTE — Telephone Encounter (Signed)
No show #1, called and left message concerning missed apt today. Reminded next apt date and time with contact information included. Included no show policy details in message as well.   Ihor Austin, LPTA/CLT; Delana Meyer [phone]

## 2022-08-05 ENCOUNTER — Telehealth (HOSPITAL_COMMUNITY): Payer: Self-pay

## 2022-08-05 ENCOUNTER — Encounter (HOSPITAL_COMMUNITY): Payer: Medicaid Other

## 2022-08-05 NOTE — Telephone Encounter (Signed)
No show #2, called and spoke to pt who stated he has been sick all night and forgot to call to cancel apt. Reminded next apt date and time and included contact number if needs to cancel/reschedule in the future. Reviewed no show policy and need to schedule apts one at a time, verbalized understanding.  Ihor Austin, LPTA/CLT; Delana Meyer [phone]

## 2022-08-10 ENCOUNTER — Telehealth (HOSPITAL_COMMUNITY): Payer: Self-pay

## 2022-08-10 ENCOUNTER — Encounter (HOSPITAL_COMMUNITY): Payer: Medicaid Other

## 2022-08-10 NOTE — Telephone Encounter (Signed)
No show #3, called and left message regarding missed apt today. Pt returned called shortly afterward and stated he is still sick. Reviewed no show policy details and for him to contact referring MD if wishes to return to therapy. Included our contact information for any questions.   Ihor Austin, LPTA/CLT; Delana Meyer [phone]

## 2022-08-11 ENCOUNTER — Encounter (HOSPITAL_COMMUNITY): Payer: Self-pay

## 2022-08-11 NOTE — Therapy (Signed)
PHYSICAL THERAPY DISCHARGE SUMMARY  Visits from Start of Care: 1  Current functional level related to goals / functional outcomes: na   Remaining deficits: na   Education / Equipment: na   Patient agrees to discharge. Patient goals were not met. Patient is being discharged due to not returning since the last visit.  

## 2022-08-12 ENCOUNTER — Encounter (HOSPITAL_COMMUNITY): Payer: Medicaid Other

## 2022-08-13 ENCOUNTER — Encounter (HOSPITAL_COMMUNITY): Payer: Medicaid Other

## 2022-08-16 ENCOUNTER — Encounter (HOSPITAL_COMMUNITY): Payer: Medicaid Other

## 2022-08-17 ENCOUNTER — Encounter (HOSPITAL_COMMUNITY): Payer: Medicaid Other

## 2022-08-19 ENCOUNTER — Encounter (HOSPITAL_COMMUNITY): Payer: Medicaid Other

## 2022-08-20 ENCOUNTER — Encounter (HOSPITAL_COMMUNITY): Payer: Medicaid Other

## 2022-08-23 ENCOUNTER — Encounter (HOSPITAL_COMMUNITY): Payer: Medicaid Other

## 2022-08-24 ENCOUNTER — Encounter (HOSPITAL_COMMUNITY): Payer: Medicaid Other

## 2022-08-26 ENCOUNTER — Encounter (HOSPITAL_COMMUNITY): Payer: Medicaid Other

## 2022-08-27 ENCOUNTER — Encounter (HOSPITAL_COMMUNITY): Payer: Medicaid Other

## 2022-09-09 DIAGNOSIS — Z9889 Other specified postprocedural states: Secondary | ICD-10-CM | POA: Diagnosis not present

## 2022-10-07 ENCOUNTER — Encounter (HOSPITAL_COMMUNITY): Payer: Self-pay

## 2022-10-07 ENCOUNTER — Emergency Department (HOSPITAL_COMMUNITY)
Admission: EM | Admit: 2022-10-07 | Discharge: 2022-10-07 | Disposition: A | Discharge status: Home / Self Care | Payer: Medicaid Other | Attending: Emergency Medicine | Admitting: Emergency Medicine

## 2022-10-07 ENCOUNTER — Other Ambulatory Visit: Payer: Self-pay

## 2022-10-07 DIAGNOSIS — X500XXA Overexertion from strenuous movement or load, initial encounter: Secondary | ICD-10-CM | POA: Insufficient documentation

## 2022-10-07 DIAGNOSIS — M79602 Pain in left arm: Secondary | ICD-10-CM | POA: Diagnosis not present

## 2022-10-07 DIAGNOSIS — R2232 Localized swelling, mass and lump, left upper limb: Secondary | ICD-10-CM | POA: Diagnosis not present

## 2022-10-07 DIAGNOSIS — M7989 Other specified soft tissue disorders: Secondary | ICD-10-CM | POA: Diagnosis not present

## 2022-10-07 MED ORDER — KETOROLAC TROMETHAMINE 60 MG/2ML IM SOLN
30.0000 mg | Freq: Once | INTRAMUSCULAR | Status: AC
  Administered 2022-10-07: 30 mg via INTRAMUSCULAR
  Filled 2022-10-07: qty 2

## 2022-10-07 MED ORDER — MELOXICAM 15 MG PO TABS: 15.0000 mg | ORAL_TABLET | Freq: Every day | ORAL | 0 refills | Status: AC

## 2022-10-07 NOTE — ED Triage Notes (Signed)
Pt states that he woke up this morning with left lower arm pain. 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

## 2022-10-07 NOTE — Discharge Instructions (Signed)
I suspect your arm is swollen from overuse which is causing increased friction on that muscle/tendon you are feeling. If you develop a fever, redness, the swelling gets worse, pain gets worse or you start to lose ability to use your left hand return here immediately. Otherwise use the ace wrap daily when awake for compression and antiinflammatories. Don't lift anything heavy with your left hand/arm until swelling improves. If not improving with these measures in the next 3-4 days follow up with your primary provider or ortho if needed.

## 2022-10-08 NOTE — ED Provider Notes (Signed)
[facility] Provider Note   CSN: [id number] Arrival date & time: 10/07/22  [id number]     History  No chief complaint on file.   Jerome Lam is a 27 y.o. male.  27 yo M here with left arm pain. States he works in [employer] heavy things and has been working hard lately. Now having left forearm pain and feeling like it is swollen and vibrating. No injury. No fall. No hand pain. No neuro changes.         Home Medications Prior to Admission medications   Medication Sig Start Date End Date Taking? Authorizing Provider  meloxicam (MOBIC) 15 MG tablet Take 1 tablet (15 mg total) by mouth daily for 7 days. 10/07/22 10/14/22 Yes Promyse Ardito, Corene Cornea, MD  albuterol (PROVENTIL HFA;VENTOLIN HFA) 108 (90 BASE) MCG/ACT inhaler Inhale 2 puffs into the lungs every 6 (six) hours as needed for wheezing or shortness of breath.    [provider]  budesonide-formoterol (SYMBICORT) 160-4.5 MCG/ACT inhaler Inhale 2 puffs into the lungs 2 (two) times daily as needed (shortness of breath).    [provider]  levocetirizine (XYZAL) 5 MG tablet Take 5 mg by mouth every morning.    [provider]  montelukast (SINGULAIR) 10 MG tablet Take 10 mg by mouth every morning.     [provider]  ondansetron (ZOFRAN) 4 MG tablet Take 1 tablet (4 mg total) by mouth every 8 (eight) hours as needed for nausea or vomiting. 05/24/22   Nicholes Stairs, MD      Allergies    Bee venom    Review of Systems   Review of Systems  Physical Exam Updated Vital Signs BP (!) 144/91   Pulse 66   Temp 98.4 F (36.9 C)   Resp 18   Wt 77.1 kg   SpO2 98%   BMI 27.44 kg/m  Physical Exam Vitals and nursing note reviewed.  Constitutional:      Appearance: He is well-developed.  HENT:     Head: Normocephalic and atraumatic.     Mouth/Throat:     Mouth: Mucous membranes are moist.     Pharynx: Oropharynx is clear.  Eyes:     Pupils:  Pupils are equal, round, and reactive to light.  Cardiovascular:     Rate and Rhythm: Normal rate.  Pulmonary:     Effort: Pulmonary effort is normal. No respiratory distress.  Abdominal:     General: There is no distension.  Musculoskeletal:        General: Swelling (mild edema to left arm with some friction over his left brachioradialis ligament) present. Normal range of motion.     Cervical back: Normal range of motion.  Neurological:     Mental Status: He is alert.     ED Results / Procedures / Treatments   Labs (all labs ordered are listed, but only abnormal results are displayed) Labs Reviewed - No data to display  EKG None  Radiology No results found.  Procedures Procedures    Medications Ordered in ED Medications  ketorolac (TORADOL) injection 30 mg (30 mg Intramuscular Given 10/07/22 [id number])    ED Course/ Medical Decision Making/ A&P                             Medical Decision Making Risk Prescription drug management.   Suspect mild edema secondary to overuse. Compartments soft, doubt compartment syndrome.  No e/o rhabdo. No injury to suggest need for imaging. NVI. Will plan for ace wrap, rest, NSAIDs.  Final Clinical Impression(s) / ED Diagnoses Final diagnoses:  Left arm swelling    Rx / DC Orders ED Discharge Orders          Ordered    meloxicam (MOBIC) 15 MG tablet  Daily        10/07/22 0610              Enmanuel Zufall, Corene Cornea, MD 10/08/22 0106

## 2022-10-28 IMAGING — CR DG FINGER INDEX 2+V*R*
3 series · 3 of 3 positions shown · non-contrast
Comparison: None.

CLINICAL DATA: Laceration

EXAM:
RIGHT INDEX FINGER 2+V

[finger ap]
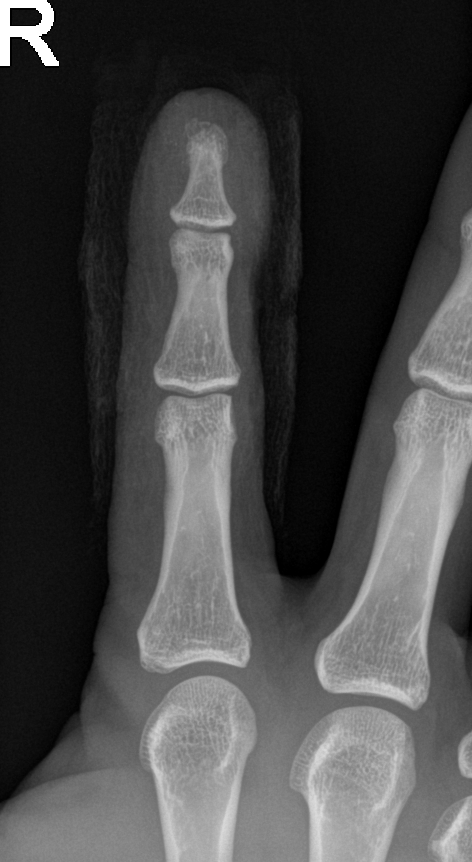

[finger obl]
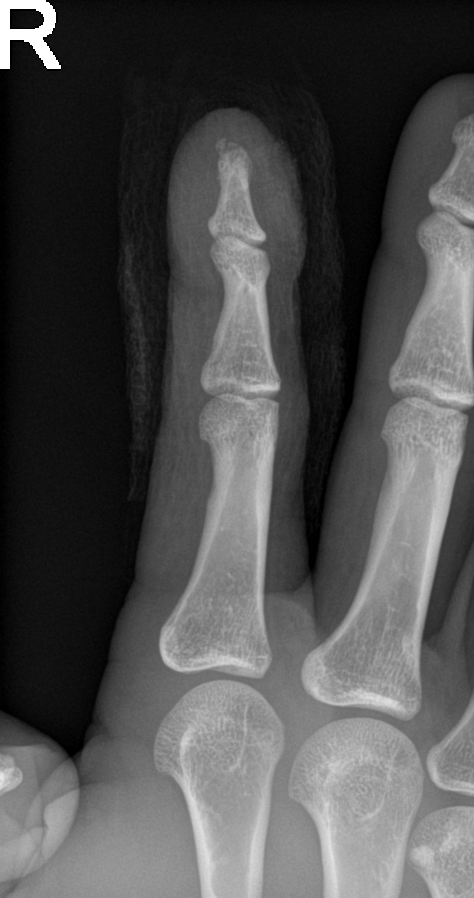

[finger lat]
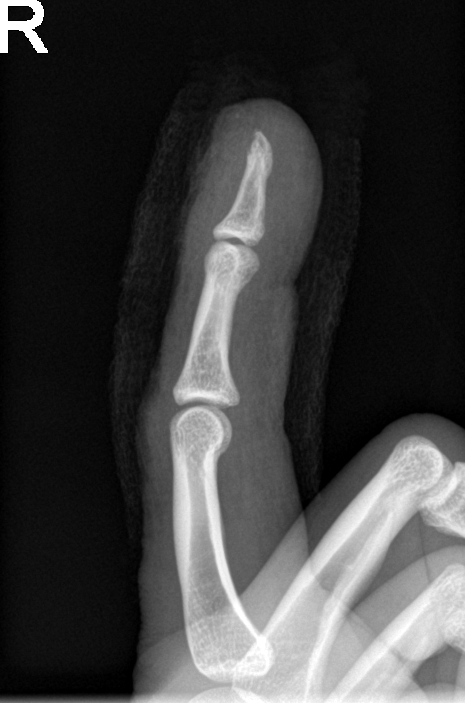

[3 of 3 positions shown; findings below may reference images not displayed]

FINDINGS: Acute mildly comminuted fracture involving the tuft of the distal
phalanx. Punctate density at the nail bed.
IMPRESSION: Acute mildly comminuted fracture involving the tuft of the distal
phalanx.

## 2023-01-03 ENCOUNTER — Encounter (HOSPITAL_COMMUNITY): Payer: Self-pay | Admitting: Emergency Medicine

## 2023-01-03 ENCOUNTER — Emergency Department (HOSPITAL_COMMUNITY): Payer: Medicaid Other

## 2023-01-03 ENCOUNTER — Emergency Department (HOSPITAL_COMMUNITY)
Admission: EM | Admit: 2023-01-03 | Discharge: 2023-01-03 | Disposition: A | Discharge status: Home / Self Care | Payer: Medicaid Other | Attending: Emergency Medicine | Admitting: Emergency Medicine

## 2023-01-03 ENCOUNTER — Other Ambulatory Visit: Payer: Self-pay

## 2023-01-03 DIAGNOSIS — R0789 Other chest pain: Secondary | ICD-10-CM | POA: Diagnosis not present

## 2023-01-03 DIAGNOSIS — R0781 Pleurodynia: Secondary | ICD-10-CM | POA: Diagnosis present

## 2023-01-03 DIAGNOSIS — J45909 Unspecified asthma, uncomplicated: Secondary | ICD-10-CM | POA: Diagnosis not present

## 2023-01-03 DIAGNOSIS — W51XXXA Accidental striking against or bumped into by another person, initial encounter: Secondary | ICD-10-CM | POA: Diagnosis not present

## 2023-01-03 DIAGNOSIS — S2241XA Multiple fractures of ribs, right side, initial encounter for closed fracture: Secondary | ICD-10-CM

## 2023-01-03 MED ORDER — NAPROXEN 250 MG PO TABS
500.0000 mg | ORAL_TABLET | Freq: Once | ORAL | Status: AC
  Administered 2023-01-03: 500 mg via ORAL
  Filled 2023-01-03: qty 2

## 2023-01-03 MED ORDER — METHOCARBAMOL 500 MG PO TABS: 500.0000 mg | ORAL_TABLET | Freq: Three times a day (TID) | ORAL | 0 refills | Status: DC | PRN

## 2023-01-03 MED ORDER — NAPROXEN 500 MG PO TABS: 500.0000 mg | ORAL_TABLET | Freq: Two times a day (BID) | ORAL | 0 refills | Status: DC

## 2023-01-03 MED ORDER — LIDOCAINE 5 % EX PTCH: 1.0000 | MEDICATED_PATCH | CUTANEOUS | 0 refills | Status: DC

## 2023-01-03 MED ORDER — METHOCARBAMOL 500 MG PO TABS
1000.0000 mg | ORAL_TABLET | Freq: Once | ORAL | Status: AC
  Administered 2023-01-03: 1000 mg via ORAL
  Filled 2023-01-03: qty 2
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
## 2023-01-03 NOTE — ED Notes (Signed)
Patient verbalizes understanding of discharge instructions. Opportunity for questioning and answers were provided. Armband removed by staff, pt discharged from ED. Ambulated out to lobby  
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
## 2023-01-03 NOTE — Discharge Instructions (Signed)
You were evaluated in the Emergency Department and after careful evaluation, we did not find any emergent condition requiring admission or further testing in the hospital.  Your exam/testing today was overall reassuring.  Your x-ray showed 2 broken ribs.  Use the Naprosyn anti-inflammatory twice daily.  Use the numbing patches daily.  Use the Robaxin muscle relaxer for more significant pain.  Use caution as this medication can cause drowsiness.  Take deep breaths throughout the day.  You will have lingering pain for a few weeks.  Please return to the Emergency Department if you experience any worsening of your condition.  Thank you for allowing [place] to be a part of your care.

## 2023-01-17 ENCOUNTER — Ambulatory Visit
Admission: EM | Admit: 2023-01-17 | Discharge: 2023-01-17 | Discharge status: Against Medical Advice | Payer: Medicaid Other

## 2023-01-17 NOTE — ED Notes (Signed)
Called from waiting area without response. 

## 2023-01-17 NOTE — ED Notes (Signed)
No response from waiting area. 

## 2023-04-19 ENCOUNTER — Ambulatory Visit (HOSPITAL_COMMUNITY)
Admission: RE | Admit: 2023-04-19 | Discharge: 2023-04-19 | Disposition: A | Discharge status: Home / Self Care | Payer: Medicaid Other | Source: Ambulatory Visit | Attending: Otolaryngology | Admitting: Otolaryngology

## 2023-04-19 ENCOUNTER — Other Ambulatory Visit (HOSPITAL_COMMUNITY): Payer: Self-pay | Admitting: Otolaryngology

## 2023-04-19 DIAGNOSIS — M25561 Pain in right knee: Secondary | ICD-10-CM | POA: Insufficient documentation

## 2023-04-19 DIAGNOSIS — S83231A Complex tear of medial meniscus, current injury, right knee, initial encounter: Secondary | ICD-10-CM | POA: Diagnosis not present

## 2023-04-19 DIAGNOSIS — M25461 Effusion, right knee: Secondary | ICD-10-CM | POA: Diagnosis not present

## 2023-04-28 DIAGNOSIS — S83241D Other tear of medial meniscus, current injury, right knee, subsequent encounter: Secondary | ICD-10-CM | POA: Diagnosis not present

## 2023-04-28 DIAGNOSIS — Z9889 Other specified postprocedural states: Secondary | ICD-10-CM | POA: Diagnosis not present

## 2023-05-03 ENCOUNTER — Other Ambulatory Visit: Payer: Self-pay

## 2023-05-03 ENCOUNTER — Emergency Department (HOSPITAL_COMMUNITY): Payer: Medicaid Other

## 2023-05-03 ENCOUNTER — Encounter (HOSPITAL_COMMUNITY): Payer: Self-pay | Admitting: Emergency Medicine

## 2023-05-03 ENCOUNTER — Emergency Department (HOSPITAL_COMMUNITY)
Admission: EM | Admit: 2023-05-03 | Discharge: 2023-05-03 | Disposition: A | Discharge status: Home / Self Care | Payer: Medicaid Other | Attending: Emergency Medicine | Admitting: Emergency Medicine

## 2023-05-03 DIAGNOSIS — N481 Balanitis: Secondary | ICD-10-CM | POA: Insufficient documentation

## 2023-05-03 DIAGNOSIS — D72829 Elevated white blood cell count, unspecified: Secondary | ICD-10-CM | POA: Diagnosis not present

## 2023-05-03 DIAGNOSIS — L03116 Cellulitis of left lower limb: Secondary | ICD-10-CM | POA: Insufficient documentation

## 2023-05-03 DIAGNOSIS — M25462 Effusion, left knee: Secondary | ICD-10-CM | POA: Diagnosis present

## 2023-05-03 LAB — COMPREHENSIVE METABOLIC PANEL
ALT: 31 U/L (ref 0–44)
AST: 28 U/L (ref 15–41)
Albumin: 4.1 g/dL (ref 3.5–5.0)
Alkaline Phosphatase: 56 U/L (ref 38–126)
Anion gap: 12 (ref 5–15)
BUN: 16 mg/dL (ref 6–20)
CO2: 23 mmol/L (ref 22–32)
Calcium: 8.9 mg/dL (ref 8.9–10.3)
Chloride: 102 mmol/L (ref 98–111)
Creatinine, Ser: 0.76 mg/dL (ref 0.61–1.24)
GFR, Estimated: >60 mL/min (ref >60)
Glucose, Bld: 132 mg/dL — ABNORMAL HIGH (ref 70–99)
Potassium: 3.7 mmol/L (ref 3.5–5.1)
Sodium: 137 mmol/L (ref 135–145)
Total Bilirubin: 1.3 mg/dL — ABNORMAL HIGH (ref 0.3–1.2)
Total Protein: 7.4 g/dL (ref 6.5–8.1)

## 2023-05-03 LAB — CBC WITH DIFFERENTIAL/PLATELET
Abs Immature Granulocytes: 0.04 10*3/uL (ref 0.00–0.07)
Basophils Absolute: 0.1 10*3/uL (ref 0.0–0.1)
Basophils Relative: 1 %
Eosinophils Absolute: 0.3 10*3/uL (ref 0.0–0.5)
Eosinophils Relative: 2 %
HCT: 41 % (ref 39.0–52.0)
Hemoglobin: 14.6 g/dL (ref 13.0–17.0)
Immature Granulocytes: 0 %
Lymphocytes Relative: 20 %
Lymphs Abs: 2.4 10*3/uL (ref 0.7–4.0)
MCH: 33.5 pg (ref 26.0–34.0)
MCHC: 35.6 g/dL (ref 30.0–36.0)
MCV: 94 fL (ref 80.0–100.0)
Monocytes Absolute: 0.8 10*3/uL (ref 0.1–1.0)
Monocytes Relative: 7 %
Neutro Abs: 8.5 10*3/uL — ABNORMAL HIGH (ref 1.7–7.7)
Neutrophils Relative %: 70 %
Platelets: 190 10*3/uL (ref 150–400)
RBC: 4.36 MIL/uL (ref 4.22–5.81)
RDW: 11.9 % (ref 11.5–15.5)
WBC: 12.1 10*3/uL — ABNORMAL HIGH (ref 4.0–10.5)
nRBC: 0 % (ref 0.0–0.2)

## 2023-05-03 LAB — LACTIC ACID, PLASMA: Lactic Acid, Venous: 1.1 mmol/L (ref 0.5–1.9)

## 2023-05-03 MED ORDER — HYDROCODONE-ACETAMINOPHEN 5-325 MG PO TABS
1.0000 | ORAL_TABLET | Freq: Once | ORAL | Status: AC
  Administered 2023-05-03: 1 via ORAL
  Filled 2023-05-03: qty 1

## 2023-05-03 MED ORDER — IBUPROFEN 400 MG PO TABS
600.0000 mg | ORAL_TABLET | Freq: Once | ORAL | Status: AC
  Administered 2023-05-03: 600 mg via ORAL
  Filled 2023-05-03: qty 2

## 2023-05-03 MED ORDER — IBUPROFEN 600 MG PO TABS
600.0000 mg | ORAL_TABLET | Freq: Four times a day (QID) | ORAL | 0 refills | Status: DC | PRN
Start: 2023-05-03 — End: 2023-09-14

## 2023-05-03 MED ORDER — CEPHALEXIN 500 MG PO CAPS
500.0000 mg | ORAL_CAPSULE | Freq: Once | ORAL | Status: AC
  Administered 2023-05-03: 500 mg via ORAL
  Filled 2023-05-03: qty 1

## 2023-05-03 MED ORDER — CLOTRIMAZOLE 1 % EX CREA
TOPICAL_CREAM | CUTANEOUS | 0 refills | Status: DC
Start: 2023-05-03 — End: 2023-09-14

## 2023-05-03 MED ORDER — CEPHALEXIN 500 MG PO CAPS
500.0000 mg | ORAL_CAPSULE | Freq: Four times a day (QID) | ORAL | 0 refills | Status: AC
Start: 2023-05-03 — End: 2023-05-10

## 2023-05-03 NOTE — ED Provider Notes (Signed)
[place] EMERGENCY DEPARTMENT AT [facility] Provider Note   CSN: [id number] Arrival date & time: 05/03/23  1528     History  Chief Complaint  Patient presents with   Insect Bite    Jerome Lam is a 27 y.o. male.  The ER for left knee redness and swelling.  States about a week ago he had a small bump and he tried to squeeze it.  He has had some increased swelling since then and over the past couple of days its gotten worse and now has redness extending to his left lateral calf and medial aspect of the right knee.  He denies any significant swelling, he is able to ambulate and flex and extend the knee.  Denies trauma.  States he is been squeezing the area and gotten some clear orange-colored fluid out of it denies purulent fluid drainage.  HPI     Home Medications Prior to Admission medications   Medication Sig Start Date End Date Taking? Authorizing Provider  ibuprofen (ADVIL) 600 MG tablet Take 1 tablet (600 mg total) by mouth every 6 (six) hours as needed. 05/03/23  Yes Navjot Loera A, PA-C  albuterol (PROVENTIL HFA;VENTOLIN HFA) 108 (90 BASE) MCG/ACT inhaler Inhale 2 puffs into the lungs every 6 (six) hours as needed for wheezing or shortness of breath.    [provider]  budesonide-formoterol (SYMBICORT) 160-4.5 MCG/ACT inhaler Inhale 2 puffs into the lungs 2 (two) times daily as needed (shortness of breath).    [provider]  cephALEXin (KEFLEX) 500 MG capsule Take 1 capsule (500 mg total) by mouth 4 (four) times daily for 7 days. 05/03/23 05/10/23 Yes Burlin Mcnair A, PA-C  clotrimazole (LOTRIMIN) 1 % cream Apply to affected area 2 times daily 05/03/23  Yes Herman Fiero A, PA-C  levocetirizine (XYZAL) 5 MG tablet Take 5 mg by mouth every morning.    [provider]  lidocaine (LIDODERM) 5 % Place 1 patch onto the skin daily. Remove & Discard patch within 12 hours or as directed by MD 01/03/23   Sabas Sous, MD  methocarbamol  (ROBAXIN) 500 MG tablet Take 1 tablet (500 mg total) by mouth every 8 (eight) hours as needed for muscle spasms. 01/03/23   Sabas Sous, MD  montelukast (SINGULAIR) 10 MG tablet Take 10 mg by mouth every morning.     [provider]  naproxen (NAPROSYN) 500 MG tablet Take 1 tablet (500 mg total) by mouth 2 (two) times daily. 01/03/23   Sabas Sous, MD  ondansetron (ZOFRAN) 4 MG tablet Take 1 tablet (4 mg total) by mouth every 8 (eight) hours as needed for nausea or vomiting. 05/24/22   Yolonda Kida, MD      Allergies    Bee venom    Review of Systems   Review of Systems  Physical Exam Updated Vital Signs BP 136/82 (BP Location: Right Arm)   Pulse 86   Temp 98.5 F (36.9 C) (Oral)   Resp 14   Ht 5\' 6"  (1.676 m)   Wt 77.1 kg   SpO2 99%   BMI 27.44 kg/m  Physical Exam Vitals and nursing note reviewed.  Constitutional:      General: He is not in acute distress.    Appearance: He is well-developed.  HENT:     Head: Normocephalic and atraumatic.     Mouth/Throat:     Mouth: Mucous membranes are moist.  Eyes:     Conjunctiva/sclera: Conjunctivae normal.  Cardiovascular:     Rate and Rhythm: Normal rate and regular rhythm.     Heart sounds: No murmur heard. Pulmonary:     Effort: Pulmonary effort is normal. No respiratory distress.     Breath sounds: Normal breath sounds.  Abdominal:     Palpations: Abdomen is soft.     Tenderness: There is no abdominal tenderness.  Genitourinary:    Comments: Mild scaly erythematous skin just proximal to the glans on the ventral surface of the penis.  No drainage, no ulcerations or blistering.  No urethral discharge.  Testicles nontender no swelling Musculoskeletal:        General: No swelling.     Cervical back: Neck supple.     Comments: Overlying erythema and warmth to left knee as pictured., no crepitus, no induration or fluctuance.  Skin:    General: Skin is warm and dry.     Capillary Refill: Capillary refill  takes less than 2 seconds.  Neurological:     General: No focal deficit present.     Mental Status: He is alert and oriented to person, place, and time.  Psychiatric:        Mood and Affect: Mood normal.        ED Results / Procedures / Treatments   Labs (all labs ordered are listed, but only abnormal results are displayed) Labs Reviewed  COMPREHENSIVE METABOLIC PANEL - Abnormal; Notable for the following components:      Result Value   Glucose, Bld 132 (*)    Total Bilirubin 1.3 (*)    All other components within normal limits  CBC WITH DIFFERENTIAL/PLATELET - Abnormal; Notable for the following components:   WBC 12.1 (*)    Neutro Abs 8.5 (*)    All other components within normal limits  CULTURE, BLOOD (ROUTINE X 2)  CULTURE, BLOOD (ROUTINE X 2)  LACTIC ACID, PLASMA    EKG None  Radiology DG Knee 2 Views Left  Result Date: 05/03/2023 CLINICAL DATA:  Concern for cellulitis, spider bite EXAM: LEFT KNEE - 1-2 VIEW COMPARISON:  None Available. FINDINGS: No evidence of fracture, dislocation, or joint effusion. No evidence of arthropathy or other focal bone abnormality. Lateral and anterior patella diffuse subcutaneous edema noted. No radiopaque foreign body. IMPRESSION: Lateral and anterior patella subcutaneous edema. No acute osseous finding. Electronically Signed   By: Judie Petit.  Shick M.D.   On: 05/03/2023 16:03    Procedures Procedures    Medications Ordered in ED Medications  cephALEXin (KEFLEX) capsule 500 mg (500 mg Oral Given 05/03/23 1707)  HYDROcodone-acetaminophen (NORCO/VICODIN) 5-325 MG per tablet 1 tablet (1 tablet Oral Given 05/03/23 1807)  ibuprofen (ADVIL) tablet 600 mg (600 mg Oral Given 05/03/23 1808)    ED Course/ Medical Decision Making/ A&P Clinical Course as of 05/03/23 1812  Tue May 03, 2023  1646 Had small bump on left anterior knee that he tried to squeeze about a week ago it gradually started getting more red but today redness spread significantly,  thin clear orange fluid has drained when he has tried to squeeze.  He denies fever or chills, no nausea or vomiting.  Denies IV drug use.  Exam consistent with cellulitis.  No induration or fluctuance to suggest abscess.  Labs are pending but will plan on cephalosporin and [CB]    Clinical Course User Index [CB] Carmel Sacramento A, PA-C  Medical Decision Making This patient presents to the ED for concern of pain and swelling and redness, this involves an extensive number of treatment options, and is a complaint that carries with it a high risk of complications and morbidity.  The differential diagnosis includes cellulitis, abscess, septic arthritis, other   Co morbidities that complicate the patient evaluation :   None   Additional history obtained:  Additional history obtained from EMR External records from outside source obtained and reviewed including notes   Lab Tests:  I Ordered, and personally interpreted labs.  The pertinent results include: CBC shows mild leukocytosis of 12.1, CMP is bilirubin 1.3 which is lower than patient's usual, otherwise normal, lactic acid normal   Imaging Studies ordered:  I ordered imaging studies including x-ray left knee which shows no fracture, no dislocation no joint effusion I independently visualized and interpreted imaging within scope of identifying emergent findings  I agree with the radiologist interpretation     Problem List / ED Course / Critical interventions / Medication management  Is here with nonpurulent cellulitis to the left knee.  Point-of-care ultrasound performed by me there is cobblestoning suggestive of cellulitis but no evidence of any organized fluid collections to suggest abscess.  There is a small area on his knee that he tried to squeeze and is gradually gotten more red.  He is having no fever.  Patient asked I rechecked his temperature in the room prior to discharge and was 98.6, is not  tachycardic, he is not tachypneic, does not meet sepsis criteria.  Will treat with cephalosporin, NSAIDs for pain and inflammation.  I outlined his erythema with a skin marker and he is informed to follow-up in 2 days for recheck at urgent care since he does not have a PCP.  Given strict instructions on return precautions.  Patient has no history of drug abuse.  Specifically no IV drug abuse.  At the end of the visit patient stated he had noticed while in the room that he was having some this to the skin of his penis.  Chaperoned exam reveals a small amount of scaly erythematous skin on his penis consistent with fungal infection, discussed with patient likely balanitis will prescribe clotrimazole and have him follow-up closely, given strict return precautions.  He has no other GU swelling or redness to suggest infection.  Lacerations, denies concern for STI.  I have reviewed the patients home medicines and have made adjustments as needed       Amount and/or Complexity of Data Reviewed Labs: ordered. Radiology: ordered.  Risk Prescription drug management.           Final Clinical Impression(s) / ED Diagnoses Final diagnoses:  Cellulitis of left lower extremity  Balanitis    Rx / DC Orders ED Discharge Orders          Ordered    clotrimazole (LOTRIMIN) 1 % cream        05/03/23 1810    cephALEXin (KEFLEX) 500 MG capsule  4 times daily        05/03/23 1810    ibuprofen (ADVIL) 600 MG tablet  Every 6 hours PRN        05/03/23 1811              Ma Rings, PA-C 05/03/23 1812    Terrilee Files, MD 05/04/23 1001

## 2023-05-03 NOTE — Discharge Instructions (Addendum)
You are given a prescription for Keflex which is an antibiotic for the skin infection to your knee.  As we discussed there is no evidence at this time of an abscess requiring drainage.  If you develop fever, chills, increased redness or other worrisome changes.  The rash on your penis is likely a fungal infection, you are given topical Lotrimin cream to use twice a day.  Follow-up with your primary care doctor for this.  If you have new or worsening symptoms come back to the ER.

## 2023-05-03 NOTE — ED Notes (Signed)
Pt in bed. Calm, cooperative and in NAD. Reports pain in the affected extremity. Antibiotics given as order.

## 2023-05-03 NOTE — ED Triage Notes (Signed)
Pt has possible spider bite to left knee, red and swollen, from left knee, down to left calf, for several days.

## 2023-05-04 ENCOUNTER — Ambulatory Visit: Payer: Medicaid Other | Admitting: Internal Medicine

## 2023-05-08 LAB — CULTURE, BLOOD (ROUTINE X 2)
Culture: NO GROWTH
Culture: NO GROWTH
Special Requests: ADEQUATE

## 2023-07-19 DIAGNOSIS — M79641 Pain in right hand: Secondary | ICD-10-CM | POA: Diagnosis not present

## 2023-07-19 DIAGNOSIS — M79642 Pain in left hand: Secondary | ICD-10-CM | POA: Diagnosis not present

## 2023-08-29 DIAGNOSIS — G5603 Carpal tunnel syndrome, bilateral upper limbs: Secondary | ICD-10-CM | POA: Diagnosis not present

## 2023-09-14 ENCOUNTER — Encounter: Payer: Self-pay | Admitting: Family Medicine

## 2023-09-14 ENCOUNTER — Ambulatory Visit: Payer: Medicaid Other | Admitting: Family Medicine

## 2023-09-14 VITALS — BP 122/80 | HR 64 | Resp 12 | Ht 66.0 in | Wt 192.2 lb

## 2023-09-14 DIAGNOSIS — J4520 Mild intermittent asthma, uncomplicated: Secondary | ICD-10-CM | POA: Diagnosis not present

## 2023-09-14 DIAGNOSIS — R7309 Other abnormal glucose: Secondary | ICD-10-CM | POA: Diagnosis not present

## 2023-09-14 DIAGNOSIS — R17 Unspecified jaundice: Secondary | ICD-10-CM | POA: Diagnosis not present

## 2023-09-14 DIAGNOSIS — F330 Major depressive disorder, recurrent, mild: Secondary | ICD-10-CM | POA: Insufficient documentation

## 2023-09-14 DIAGNOSIS — Z1159 Encounter for screening for other viral diseases: Secondary | ICD-10-CM | POA: Diagnosis not present

## 2023-09-14 DIAGNOSIS — K219 Gastro-esophageal reflux disease without esophagitis: Secondary | ICD-10-CM

## 2023-09-14 LAB — BASIC METABOLIC PANEL
BUN: 11 mg/dL (ref 6–23)
CO2: 27 meq/L (ref 19–32)
Calcium: 9.4 mg/dL (ref 8.4–10.5)
Chloride: 104 meq/L (ref 96–112)
Creatinine, Ser: 0.76 mg/dL (ref 0.40–1.50)
GFR: 124.8 mL/min (ref >60.00)
Glucose, Bld: 103 mg/dL — ABNORMAL HIGH (ref 70–99)
Potassium: 3.9 meq/L (ref 3.5–5.1)
Sodium: 138 meq/L (ref 135–145)

## 2023-09-14 LAB — HEPATIC FUNCTION PANEL
ALT: 18 U/L (ref 0–53)
AST: 22 U/L (ref 0–37)
Albumin: 4.7 g/dL (ref 3.5–5.2)
Alkaline Phosphatase: 45 U/L (ref 39–117)
Bilirubin, Direct: 0.1 mg/dL (ref 0.0–0.3)
Total Bilirubin: 0.7 mg/dL (ref 0.2–1.2)
Total Protein: 7.6 g/dL (ref 6.0–8.3)

## 2023-09-14 LAB — CBC
HCT: 44.5 % (ref 39.0–52.0)
Hemoglobin: 15.6 g/dL (ref 13.0–17.0)
MCHC: 35 g/dL (ref 30.0–36.0)
MCV: 97.4 fL (ref 78.0–100.0)
Platelets: 212 10*3/uL (ref 150.0–400.0)
RBC: 4.57 Mil/uL (ref 4.22–5.81)
RDW: 12.6 % (ref 11.5–15.5)
WBC: 8 10*3/uL (ref 4.0–10.5)

## 2023-09-14 LAB — HEMOGLOBIN A1C: Hgb A1c MFr Bld: 5.1 % (ref 4.6–6.5)

## 2023-09-14 MED ORDER — ESCITALOPRAM OXALATE 10 MG PO TABS
10.0000 mg | ORAL_TABLET | Freq: Every day | ORAL | 2 refills | Status: DC
Start: 2023-09-14 — End: 2023-10-25

## 2023-09-14 MED ORDER — ALBUTEROL SULFATE HFA 108 (90 BASE) MCG/ACT IN AERS: 2.0000 | INHALATION_SPRAY | Freq: Four times a day (QID) | RESPIRATORY_TRACT | 2 refills | Status: AC | PRN

## 2023-09-14 MED ORDER — PANTOPRAZOLE SODIUM 40 MG PO TBEC
40.0000 mg | DELAYED_RELEASE_TABLET | Freq: Every day | ORAL | 2 refills | Status: DC
Start: 2023-09-14 — End: 2023-10-25

## 2023-09-14 NOTE — Patient Instructions (Addendum)
A few things to remember from today's visit:  Encounter for HCV screening test for low risk patient - Plan: Hepatitis C antibody  Elevated glucose level - Plan: Basic metabolic panel, Hemoglobin A1c  Gastroesophageal reflux disease, unspecified whether esophagitis present - Plan: pantoprazole (PROTONIX) 40 MG tablet  Elevated bilirubin - Plan: Basic metabolic panel, Hepatic function panel, CBC  Mild recurrent major depression (HCC) - Plan: escitalopram (LEXAPRO) 10 MG tablet Today we started Lexapro, this type of medications can increase suicidal risk. This is more prevalent among children,adolecents, and young adults with major depression or other psychiatric disorders. It can also make depression worse. Most common side effects are gastrointestinal, self limited after a few weeks: diarrhea, nausea, constipation  Or diarrhea among some.  In general it is well tolerated. We will follow closely.  For heartburn, Protonix 30 min before breakfast for 8 weeks.   If you need refills for medications you take chronically, please call your pharmacy. Do not use My Chart to request refills or for acute issues that need immediate attention. If you send a my chart message, it may take a few days to be addressed, specially if I am not in the office.  Please be sure medication list is accurate. If a new problem present, please set up appointment sooner than planned today.

## 2023-09-14 NOTE — Progress Notes (Signed)
HPI: [name] is a 28 y.o. male with a PMHx significant for GERD and constipation, who is here today to establish care.  Former PCP: Jerome Lam. Jerome Hope, MD Last preventive routine visit: more than one year ago.   Exercise: He doesn't go to the gym but he is active at work. He works in [employer] and gets more than 10000 steps daily.  Diet: He eats healthy in general and eats mainly at home.  Sleep: 4-6 hours Alcohol Use: Occasionally Smoking: He smokes marijuana occasionally. Has never smoked cigarettes.  Vision: Not established with an eye doctor.  Dental: Not established with a dental.   Chronic medical problems:   Asthma:  He has symptoms with illnesses.  He doesn't currently have an inhaler.   Depression:  Not currently taking any medication. He has not seen a counselor or a psychiatrist.  He says he will have periods of depression for a few days or a week at a time, but then it goes away.  No manic episodes.     09/14/2023    2:21 PM 09/14/2023    1:48 PM  Depression screen PHQ 2/9  Decreased Interest 2 0  Down, Depressed, Hopeless 2 0  PHQ - 2 Score 4 0  Altered sleeping 2   Tired, decreased energy 1   Change in appetite 2   Feeling bad or failure about yourself  1   Trouble concentrating 2   Moving slowly or fidgety/restless 0   Suicidal thoughts 1   PHQ-9 Score 13   Difficult doing work/chores Somewhat difficult       09/14/2023    2:22 PM  GAD 7 : Generalized Anxiety Score  Nervous, Anxious, on Edge 1  Control/stop worrying 3  Worry too much - different things 3  Trouble relaxing 3  Restless 2  Easily annoyed or irritable 3  Afraid - awful might happen 3  Total GAD 7 Score 18  Anxiety Difficulty Somewhat difficult   Heartburn/GERD:  He has had heartburn for 2-3 years, but says it is worsening for the last year.  He describes it as a burning sensation coming up his throat.  He has tried OTC omeprazole and Tums.   Orthopedic injuries:   Last year he had a closed fracture of multiple ribs of the right side.  He also has torn his left meniscus and his left ACL twice playing soccer.   Also has a hx of eczema.   No hx of HTN, HLD, or diabetes. He has an FMHx of HTN and HLD in his father and HLD in his mother.   Lab Results  Component Value Date   ALT 31 05/03/2023   AST 28 05/03/2023   ALKPHOS 56 05/03/2023   BILITOT 1.3 (H) 05/03/2023   Lab Results  Component Value Date   WBC 12.1 (H) 05/03/2023   HGB 14.6 05/03/2023   HCT 41.0 05/03/2023   MCV 94.0 05/03/2023   PLT 190 05/03/2023   Lab Results  Component Value Date   NA 137 05/03/2023   CL 102 05/03/2023   K 3.7 05/03/2023   CO2 23 05/03/2023   BUN 16 05/03/2023   CREATININE 0.76 05/03/2023   GFRNONAA >60 05/03/2023   CALCIUM 8.9 05/03/2023   ALBUMIN 4.1 05/03/2023   GLUCOSE 132 (H) 05/03/2023   Concerns today:   Carpal tunnel:  He is following with [facility] orthopedics for bilateral carpal tunnel syndrome. They told him he would need surgery on the right hand  first and then the left one. He is right-handed.   Review of Systems See other pertinent positives and negatives in HPI.  Current Outpatient Medications on File Prior to Visit  Medication Sig Dispense Refill   budesonide-formoterol (SYMBICORT) 160-4.5 MCG/ACT inhaler Inhale 2 puffs into the lungs 2 (two) times daily as needed (shortness of breath).     levocetirizine (XYZAL) 5 MG tablet Take 5 mg by mouth every morning.     montelukast (SINGULAIR) 10 MG tablet Take 10 mg by mouth every morning.      No current facility-administered medications on file prior to visit.    Past Medical History:  Diagnosis Date   Asthma    Attention deficit hyperactivity disorder (ADHD)    Depression    Vomiting    Since December   Allergies  Allergen Reactions   Bee Venom Swelling    Family History  Problem Relation Age of Onset   Diabetes Mother    Breast cancer Mother    Stroke Mother     Hypertension Father    Hyperlipidemia Father    Chronic Renal Failure Father    Celiac disease Neg Hx    Ulcers Neg Hx    Cholelithiasis Neg Hx     Social History   Socioeconomic History   Marital status: Single    Spouse name: Not on file   Number of children: Not on file   Years of education: Not on file   Highest education level: Not on file  Occupational History   Not on file  Tobacco Use   Smoking status: Never   Smokeless tobacco: Never  Vaping Use   Vaping status: Never Used  Substance and Sexual Activity   Alcohol use: Yes    Comment: occasionally   Drug use: Yes    Frequency: 2.0 times per week    Types: Marijuana   Sexual activity: Not on file  Other Topics Concern   Not on file  Social History Narrative   9th grade (middle college)   Social Drivers of Corporate investment banker Strain: Not on file  Food Insecurity: Not on file  Transportation Needs: Not on file  Physical Activity: Not on file  Stress: Not on file  Social Connections: Not on file    Vitals:   09/14/23 1338  BP: 122/80  Pulse: 64  Resp: 12  SpO2: 98%    Body mass index is 31.03 kg/m.  Physical Exam Vitals and nursing note reviewed.  Constitutional:      General: He is not in acute distress.    Appearance: He is well-developed.  HENT:     Head: Normocephalic and atraumatic.     Mouth/Throat:     Mouth: Mucous membranes are moist.     Pharynx: Oropharynx is clear. Uvula midline.  Eyes:     Conjunctiva/sclera: Conjunctivae normal.  Cardiovascular:     Rate and Rhythm: Normal rate and regular rhythm.     Pulses:          Dorsalis pedis pulses are 2+ on the right side and 2+ on the left side.     Heart sounds: No murmur heard. Pulmonary:     Effort: Pulmonary effort is normal. No respiratory distress.     Breath sounds: Normal breath sounds.  Abdominal:     Palpations: Abdomen is soft. There is no hepatomegaly or mass.     Tenderness: There is no abdominal tenderness.   Musculoskeletal:     Right lower  leg: No edema.     Left lower leg: No edema.  Lymphadenopathy:     Cervical: No cervical adenopathy.  Skin:    General: Skin is warm.     Findings: No erythema or rash.  Neurological:     Mental Status: He is alert and oriented to person, place, and time.     Cranial Nerves: No cranial nerve deficit.     Gait: Gait normal.  Psychiatric:     Comments: Well groomed, good eye contact.     ASSESSMENT AND PLAN:  Mr. Cristobal was seen today to establish care.   Encounter for HCV screening test for low risk patient -     Hepatitis C antibody; Future  Elevated glucose level -     Basic metabolic panel; Future -     Hemoglobin A1c; Future  Gastroesophageal reflux disease, unspecified whether esophagitis present -     Pantoprazole Sodium; Take 1 tablet (40 mg total) by mouth daily.  Dispense: 30 tablet; Refill: 2  Elevated bilirubin -     Basic metabolic panel; Future -     Hepatic function panel; Future -     CBC; Future  Mild recurrent major depression (HCC) -     Escitalopram Oxalate; Take 1 tablet (10 mg total) by mouth daily.  Dispense: 30 tablet; Refill: 2  Other orders -     Albuterol Sulfate HFA; Inhale 2 puffs into the lungs every 6 (six) hours as needed for wheezing or shortness of breath.  Dispense: 18 g; Refill: 2    Return in about 2 months (around 11/12/2023) for chronic problems.  I, Rolla Etienne Wierda, acting as a scribe for Angelos Wasco [place], MD., have documented all relevant documentation on the behalf of Win Guajardo [place], MD, as directed by  Hendrix Yurkovich [place], MD while in the presence of Adams Hinch [place], MD.   I, Denim Kalmbach [place], MD, have reviewed all documentation for this visit. The documentation on 09/14/23 for the exam, diagnosis, procedures, and orders are all accurate and complete.  Keiandra Sullenger G. [place], MD  [facility]. [place] office.

## 2023-09-15 ENCOUNTER — Encounter: Payer: Self-pay | Admitting: Family Medicine

## 2023-09-15 LAB — HEPATITIS C ANTIBODY: Hepatitis C Ab: NONREACTIVE

## 2023-09-15 NOTE — Assessment & Plan Note (Signed)
He does not recall medication he tried before, took it for less than 2-3 weeks. He agrees with trying Lexapro 10 mg daily. Some side effects discussed. He is not interested in CBT. F/U in 6-8 weeks.

## 2023-09-15 NOTE — Assessment & Plan Note (Signed)
Problem is well controlled, exacerbated by respiratory illness. Continue Albuterol inh 2 puff qid prn.

## 2023-09-15 NOTE — Assessment & Plan Note (Signed)
Problem has been worsening for the past year. Recommend Protonix 40 mg daily 30 min before breakfast for 8 weeks. GERD precautions. F/U in 8 weeks.

## 2023-10-25 ENCOUNTER — Telehealth (INDEPENDENT_AMBULATORY_CARE_PROVIDER_SITE_OTHER): Admitting: Family Medicine

## 2023-10-25 ENCOUNTER — Encounter: Payer: Self-pay | Admitting: Family Medicine

## 2023-10-25 VITALS — Ht 66.0 in

## 2023-10-25 DIAGNOSIS — F330 Major depressive disorder, recurrent, mild: Secondary | ICD-10-CM

## 2023-10-25 DIAGNOSIS — K219 Gastro-esophageal reflux disease without esophagitis: Secondary | ICD-10-CM

## 2023-10-25 MED ORDER — PANTOPRAZOLE SODIUM 40 MG PO TBEC: 40.0000 mg | DELAYED_RELEASE_TABLET | Freq: Every day | ORAL | 1 refills | Status: AC

## 2023-10-25 MED ORDER — ESCITALOPRAM OXALATE 20 MG PO TABS
20.0000 mg | ORAL_TABLET | Freq: Every day | ORAL | 1 refills | Status: AC
Start: 2023-10-25 — End: ?

## 2023-10-25 NOTE — Assessment & Plan Note (Signed)
 Reporting mild improvement, he would like to try a different medication. We discussed options,I think we can try increasing dose of Lexapro instead. He agrees with trying higher dose of Lexapro, so increase it from 10 mg to 20 mg daily. Instructed to let me know in about 6 weeks if he feels like medication is helping, otherwise we will try a different SSRI (sertraline, fluoxetine, or Celexa). He is not interested in CBT at this time.

## 2023-10-25 NOTE — Progress Notes (Signed)
 Virtual Visit via Video Note I connected with Jerome Lam on 10/25/2023 by a video enabled telemedicine application and verified that I am speaking with the correct person using two identifiers. Location patient: home Location provider:work office Persons participating in the virtual visit: patient, provider, scribe  I discussed the limitations of evaluation and management by telemedicine and the availability of in person appointments. The patient expressed understanding and agreed to proceed.  Chief Complaint  Patient presents with   Medical Management of Chronic Issues   HPI: Jerome Lam is a 28 y.o. male with a PMHx significant for GERD, depression, and constipation who is being seen on video today for follow-up. He was seen on 09/14/2023, when he was complaining of depression. Lexapro 10 mg was restarted. He feels like it has helped marginally but not significantly.  Denies any side effects.  Negative for CP, dyspnea, palpitation, or edema. No suicidal ideation.  GERD:  Currently on pantoprazole 40 mg daily. He says the medicine helps substantially but he has "little" heartburn when he skips medication. Negative for abdominal pain, nausea, vomiting, changes in bowel habits, or melena.  ROS: See pertinent positives and negatives per HPI.  Past Medical History:  Diagnosis Date   Asthma    Attention deficit hyperactivity disorder (ADHD)    Depression    Vomiting    Since December    Past Surgical History:  Procedure Laterality Date   KNEE ARTHROSCOPY WITH ANTERIOR CRUCIATE LIGAMENT (ACL) REPAIR WITH HAMSTRING GRAFT Right 05/24/2022   Procedure: KNEE ARTHROSCOPY WITH ANTERIOR CRUCIATE LIGAMENT (ACL) RECONSTRUCTION WITH HAMSTRING GRAFT, PARTIAL LATERAL MENISCECTOMY;  Surgeon: Yolonda Kida, MD;  Location: [facility];  Service: Orthopedics;  Laterality: Right;  120   MENISCUS REPAIR Right 05/24/2022   Procedure: REPAIR OF MEDIAL MENISCUS;  Surgeon: Yolonda Kida, MD;  Location: [facility];  Service: Orthopedics;  Laterality: Right;  120   NO PAST SURGERIES      Family History  Problem Relation Age of Onset   Diabetes Mother    Breast cancer Mother    Stroke Mother    Hypertension Father    Hyperlipidemia Father    Chronic Renal Failure Father    Celiac disease Neg Hx    Ulcers Neg Hx    Cholelithiasis Neg Hx     Social History   Socioeconomic History   Marital status: Single    Spouse name: Not on file   Number of children: Not on file   Years of education: Not on file   Highest education level: Not on file  Occupational History   Not on file  Tobacco Use   Smoking status: Never   Smokeless tobacco: Never  Vaping Use   Vaping status: Never Used  Substance and Sexual Activity   Alcohol use: Yes    Comment: occasionally   Drug use: Yes    Frequency: 2.0 times per week    Types: Marijuana   Sexual activity: Not on file  Other Topics Concern   Not on file  Social History Narrative   9th grade (middle college)   Social Drivers of Corporate investment banker Strain: Not on file  Food Insecurity: Not on file  Transportation Needs: Not on file  Physical Activity: Not on file  Stress: Not on file  Social Connections: Not on file  Intimate Partner Violence: Not on file     Current Outpatient Medications:    albuterol (VENTOLIN HFA) 108 (90 Base) MCG/ACT inhaler, Inhale  2 puffs into the lungs every 6 (six) hours as needed for wheezing or shortness of breath., Disp: 18 g, Rfl: 2   budesonide-formoterol (SYMBICORT) 160-4.5 MCG/ACT inhaler, Inhale 2 puffs into the lungs 2 (two) times daily as needed (shortness of breath)., Disp: , Rfl:    escitalopram (LEXAPRO) 20 MG tablet, Take 1 tablet (20 mg total) by mouth daily., Disp: 90 tablet, Rfl: 1   levocetirizine (XYZAL) 5 MG tablet, Take 5 mg by mouth every morning., Disp: , Rfl:    montelukast (SINGULAIR) 10 MG tablet, Take 10 mg by mouth every morning. , Disp: , Rfl:     pantoprazole (PROTONIX) 40 MG tablet, Take 1 tablet (40 mg total) by mouth daily., Disp: 90 tablet, Rfl: 1  EXAM:  VITALS per patient if applicable:Ht 5\' 6"  (1.676 m)   BMI 31.03 kg/m   GENERAL: alert, oriented, appears well and in no acute distress  HEENT: atraumatic, conjunctiva clear, no obvious abnormalities on inspection of external nose and ears  NECK: normal movements of the head and neck  LUNGS: on inspection no signs of respiratory distress, breathing rate appears normal, no obvious gross SOB, gasping or wheezing  CV: no obvious cyanosis  MS: moves all visible extremities without noticeable abnormality  PSYCH/NEURO: pleasant and cooperative, no obvious depression or anxiety, speech and thought processing grossly intact  ASSESSMENT AND PLAN:  Discussed the following assessment and plan:  Gastroesophageal reflux disease, unspecified whether esophagitis present Assessment & Plan: Problem has improved, "little" heartburn when he skips medication; so for now continue Protonix 40 mg daily. Continue GERD precautions.  Orders: -     Pantoprazole Sodium; Take 1 tablet (40 mg total) by mouth daily.  Dispense: 90 tablet; Refill: 1  Mild recurrent major depression (HCC) Assessment & Plan: Reporting mild improvement, he would like to try a different medication. We discussed options,I think we can try increasing dose of Lexapro instead. He agrees with trying higher dose of Lexapro, so increase it from 10 mg to 20 mg daily. Instructed to let me know in about 6 weeks if he feels like medication is helping, otherwise we will try a different SSRI (sertraline, fluoxetine, or Celexa). He is not interested in CBT at this time.  Orders: -     Escitalopram Oxalate; Take 1 tablet (20 mg total) by mouth daily.  Dispense: 90 tablet; Refill: 1   We discussed possible serious and likely etiologies, options for evaluation and workup, limitations of telemedicine visit vs in person visit,  treatment, treatment risks and precautions. The patient was advised to call back or seek an in-person evaluation if the symptoms worsen or if the condition fails to improve as anticipated. I discussed the assessment and treatment plan with the patient. The patient was provided an opportunity to ask questions and all were answered. The patient agreed with the plan and demonstrated an understanding of the instructions.  Return in about 6 months (around 04/26/2024).  I, Jerome Lam, acting as a scribe for Jerome Vereen [place], MD., have documented all relevant documentation on the behalf of Jerome Aiello [place], MD, as directed by  Jerome Kassel [place], MD while in the presence of Jerome Easterly [place], MD.   I, Jerome Howes [place], MD, have reviewed all documentation for this visit. The documentation on 10/25/23 for the exam, diagnosis, procedures, and orders are all accurate and complete.  Jerome Schwenn [place], MD

## 2023-10-25 NOTE — Assessment & Plan Note (Signed)
 Problem has improved, "little" heartburn when he skips medication; so for now continue Protonix 40 mg daily. Continue GERD precautions.

## 2023-10-30 ENCOUNTER — Encounter (HOSPITAL_BASED_OUTPATIENT_CLINIC_OR_DEPARTMENT_OTHER): Payer: Self-pay

## 2023-10-30 ENCOUNTER — Other Ambulatory Visit: Payer: Self-pay

## 2023-10-30 ENCOUNTER — Emergency Department (HOSPITAL_BASED_OUTPATIENT_CLINIC_OR_DEPARTMENT_OTHER): Admitting: Radiology

## 2023-10-30 ENCOUNTER — Emergency Department (HOSPITAL_BASED_OUTPATIENT_CLINIC_OR_DEPARTMENT_OTHER)
Admission: EM | Admit: 2023-10-30 | Discharge: 2023-10-30 | Disposition: A | Discharge status: Home / Self Care | Attending: Emergency Medicine | Admitting: Emergency Medicine

## 2023-10-30 DIAGNOSIS — M549 Dorsalgia, unspecified: Secondary | ICD-10-CM | POA: Diagnosis not present

## 2023-10-30 DIAGNOSIS — M546 Pain in thoracic spine: Secondary | ICD-10-CM | POA: Insufficient documentation

## 2023-10-30 DIAGNOSIS — Z7951 Long term (current) use of inhaled steroids: Secondary | ICD-10-CM | POA: Diagnosis not present

## 2023-10-30 DIAGNOSIS — M545 Low back pain, unspecified: Secondary | ICD-10-CM | POA: Diagnosis not present

## 2023-10-30 DIAGNOSIS — M4317 Spondylolisthesis, lumbosacral region: Secondary | ICD-10-CM | POA: Diagnosis not present

## 2023-10-30 DIAGNOSIS — M62830 Muscle spasm of back: Secondary | ICD-10-CM | POA: Insufficient documentation

## 2023-10-30 DIAGNOSIS — M48061 Spinal stenosis, lumbar region without neurogenic claudication: Secondary | ICD-10-CM | POA: Diagnosis not present

## 2023-10-30 DIAGNOSIS — J45909 Unspecified asthma, uncomplicated: Secondary | ICD-10-CM | POA: Diagnosis not present

## 2023-10-30 MED ORDER — LIDOCAINE 5 % EX PTCH: 1.0000 | MEDICATED_PATCH | CUTANEOUS | 0 refills | Status: AC

## 2023-10-30 MED ORDER — OXYCODONE-ACETAMINOPHEN 5-325 MG PO TABS
1.0000 | ORAL_TABLET | Freq: Once | ORAL | Status: AC
  Administered 2023-10-30: 1 via ORAL
  Filled 2023-10-30: qty 1

## 2023-10-30 MED ORDER — CYCLOBENZAPRINE HCL 10 MG PO TABS: 10.0000 mg | ORAL_TABLET | Freq: Two times a day (BID) | ORAL | 0 refills | Status: AC | PRN

## 2023-10-30 MED ORDER — KETOROLAC TROMETHAMINE 30 MG/ML IJ SOLN
30.0000 mg | Freq: Once | INTRAMUSCULAR | Status: AC
  Administered 2023-10-30: 30 mg via INTRAMUSCULAR
  Filled 2023-10-30: qty 1

## 2023-10-30 MED ORDER — OXYCODONE-ACETAMINOPHEN 5-325 MG PO TABS
1.0000 | ORAL_TABLET | ORAL | 0 refills | Status: AC | PRN
Start: 2023-10-30 — End: ?

## 2023-10-30 MED ORDER — LIDOCAINE 5 % EX PTCH
1.0000 | MEDICATED_PATCH | Freq: Once | CUTANEOUS | Status: DC
  Administered 2023-10-30: 1 via TRANSDERMAL
  Filled 2023-10-30: qty 1

## 2023-10-30 MED ORDER — CYCLOBENZAPRINE HCL 10 MG PO TABS
10.0000 mg | ORAL_TABLET | Freq: Once | ORAL | Status: AC
  Administered 2023-10-30: 10 mg via ORAL
  Filled 2023-10-30: qty 1

## 2023-10-30 NOTE — Discharge Instructions (Signed)
 Your history, exam, and evaluation are consistent with musculoskeletal back pain.  The x-rays did not show acute fracture but do show some chronic deformities and changes.  We had a shared decision-making conversation and agreed to hold on more advanced labs and imaging at this time but do want you to get the prescriptions filled to help with symptom relief and have you rest for several days.  Please follow-up with a back doctor and your primary doctor.  Please rest and stay hydrated.  If any symptoms change or worsen acutely, return to the nearest emergency department for further workup.

## 2023-10-30 NOTE — ED Provider Notes (Signed)
 Jerome Lam   CSN: [id number] Arrival date & time: 10/30/23  7829     History  Chief Complaint  Patient presents with   Back Pain    Jerome Lam is a 28 y.o. male.  The history is provided by the patient, medical records and a significant other.  Back Pain Location:  Thoracic spine and lumbar spine Quality:  Aching and cramping Radiates to:  Does not radiate Pain severity:  Moderate Pain is:  Unable to specify Onset quality:  Gradual Duration:  3 hours Timing:  Constant Progression:  Waxing and waning Chronicity:  New Context: twisting   Relieved by:  Nothing Worsened by:  Bending and twisting Ineffective treatments:  None tried Associated symptoms: no abdominal pain, no bladder incontinence, no bowel incontinence, no chest pain, no dysuria, no fever, no headaches, no leg pain, no numbness, no paresthesias, no perianal numbness, no tingling and no weakness   Risk factors comment:  Denies IV drug use or prior trauma      Home Medications Prior to Admission medications   Medication Sig Start Date End Date Taking? Authorizing Provider  albuterol (VENTOLIN HFA) 108 (90 Base) MCG/ACT inhaler Inhale 2 puffs into the lungs every 6 (six) hours as needed for wheezing or shortness of breath. 09/14/23   [place], Betty G, Jerome Lam  budesonide-formoterol [facility]) 160-4.5 MCG/ACT inhaler Inhale 2 puffs into the lungs 2 (two) times daily as needed (shortness of breath).    Provider, Historical, Jerome Lam  escitalopram (LEXAPRO) 20 MG tablet Take 1 tablet (20 mg total) by mouth daily. 10/25/23   [place], Betty G, Jerome Lam  levocetirizine (XYZAL) 5 MG tablet Take 5 mg by mouth every morning.    Provider, Historical, Jerome Lam  montelukast (SINGULAIR) 10 MG tablet Take 10 mg by mouth every morning.     Provider, Historical, Jerome Lam  pantoprazole (PROTONIX) 40 MG tablet Take 1 tablet (40 mg total) by mouth daily. 10/25/23   [place], Betty G, Jerome Lam       Allergies    Bee venom    Review of Systems   Review of Systems  Constitutional:  Negative for chills, fatigue and fever.  HENT:  Negative for congestion.   Respiratory:  Negative for cough, chest tightness, shortness of breath and wheezing.   Cardiovascular:  Negative for chest pain, palpitations and leg swelling.  Gastrointestinal:  Negative for abdominal pain, bowel incontinence, constipation, diarrhea, nausea and vomiting.  Genitourinary:  Negative for bladder incontinence, dysuria and flank pain.  Musculoskeletal:  Positive for back pain. Negative for neck pain and neck stiffness.  Skin:  Negative for rash and wound.  Neurological:  Negative for tingling, weakness, light-headedness, numbness, headaches and paresthesias.  Psychiatric/Behavioral:  Negative for agitation and confusion.   All other systems reviewed and are negative.   Physical Exam Updated Vital Signs BP 118/75   Pulse 61   Temp 98.2 F (36.8 C) (Oral)   Resp 16   SpO2 99%  Physical Exam Vitals and nursing Lam reviewed.  Constitutional:      General: He is not in acute distress.    Appearance: He is well-developed. He is not ill-appearing, toxic-appearing or diaphoretic.  HENT:     Head: Normocephalic and atraumatic.     Nose: No congestion or rhinorrhea.     Mouth/Throat:     Mouth: Mucous membranes are moist.     Pharynx: No oropharyngeal exudate or posterior oropharyngeal erythema.  Eyes:  Conjunctiva/sclera: Conjunctivae normal.     Pupils: Pupils are equal, round, and reactive to light.  Cardiovascular:     Rate and Rhythm: Normal rate and regular rhythm.     Heart sounds: No murmur heard. Pulmonary:     Effort: Pulmonary effort is normal. No respiratory distress.     Breath sounds: Normal breath sounds. No wheezing, rhonchi or rales.  Abdominal:     Palpations: Abdomen is soft.     Tenderness: There is no abdominal tenderness. There is no right CVA tenderness, left CVA tenderness,  guarding or rebound.  Musculoskeletal:        General: Tenderness present. No swelling or signs of injury.     Cervical back: Neck supple. No tenderness.     Thoracic back: Spasms and tenderness present.     Lumbar back: Spasms and tenderness present.       Back:  Skin:    General: Skin is warm and dry.     Capillary Refill: Capillary refill takes less than 2 seconds.     Findings: No erythema or rash.  Neurological:     General: No focal deficit present.     Mental Status: He is alert. Mental status is at baseline.     Sensory: No sensory deficit.     Motor: No weakness.  Psychiatric:        Mood and Affect: Mood normal.     ED Results / Procedures / Treatments   Labs (all labs ordered are listed, but only abnormal results are displayed) Labs Reviewed - No data to display  EKG None  Radiology DG Lumbar Spine Complete Result Date: 10/30/2023 CLINICAL DATA:  Mid back pain EXAM: LUMBAR SPINE - COMPLETE 4 VIEW COMPARISON:  None Available. 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 Borderline L4-5 disc space narrowing. No evidence of acute fracture or bone lesion. IMPRESSION: No acute finding. L5 pars defects. Electronically Signed   By: Tiburcio Pea M.D.   On: 10/30/2023 07:49   DG Thoracic Spine 2 View Result Date: 10/30/2023 CLINICAL DATA:  Back pain EXAM: THORACIC SPINE 2 VIEWS COMPARISON:  None. FINDINGS: There is a normal alignment of the thoracic spine. There are mild, chronic appearing anterior superior endplate deformities noted at T9, T10, T11 and T12. These are age indeterminate but appear chronic. The remaining thoracic vertebral body heights are well preserved. Visualized lumbar vertebra appear unremarkable. IMPRESSION: Mild, chronic appearing anterior superior endplate deformities at T9, T10, T11 and T12. Electronically Signed   By: Signa Kell M.D.   On: 10/30/2023 07:49    Procedures Procedures    Medications Ordered in  ED Medications  lidocaine (LIDODERM) 5 % 1 patch (1 patch Transdermal Patch Applied 10/30/23 0735)  ketorolac (TORADOL) 30 MG/ML injection 30 mg (has no administration in time range)  oxyCODONE-acetaminophen (PERCOCET/ROXICET) 5-325 MG per tablet 1 tablet (1 tablet Oral Given 10/30/23 0731)  cyclobenzaprine (FLEXERIL) tablet 10 mg (10 mg Oral Given 10/30/23 0732)    ED Course/ Medical Decision Making/ A&P                                 Medical Decision Making Amount and/or Complexity of Data Reviewed Radiology: ordered.  Risk Prescription drug management.    Jerome Lam is a 28 y.o. male past medical history significant for GERD, and asthma, depression, and ADHD who has had previous carpal tunnel symptoms who presents with  back pain.  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  He reports it is worsened when he twists and bends and moves his torso.  He denies any focal trauma or falls.  He denies any numbness or weakness in his legs.  He reports no chronic numbness in his left arm from carpal tunnel issues that he is can have surgery at some point.  Denies any weakness in his arms or legs.  Denies any urinary symptoms with no dysuria, hematuria, or pain with urination.  He denies history of kidney stones.  Denies history of IV drug use.  Denies any fevers, chills, or infectious symptoms.  He denies any chest pain, shortness of breath, cough, nausea, vomiting, or abdominal pain.  Reports some mild muscle tenderness in different parts of his body.  Denies pain rating to the groin.  On exam, patient's back is slightly tender to palpation more in the thoracic and lumbar area.  There is some muscle spasm seen on exam.  Abdomen nontender.  No murmur.  Good pulses in extremities.  No new focal neurologic abnormalities.  He does report some chronic numbness that does not bother him down his  left arm.  We had a shared decision made conversation offering labs, urine, and imaging.  Patient agrees to seems musculoskeletal given the bending and twisting exacerbating it and is strenuous work at baseline.  Will get x-ray of his thoracic and lumbar spine to look for degree just abnormality however patient agrees to hold on advanced imaging with CT or MRI at this time.  Will also hold on labs and urinalysis given his lack of urinary symptoms.  During the shared decision made conversation we agreed to give a dose of some pain medicine, muscle relaxant, and Lidoderm patch to see if this helps.  If it does, and his imaging is reassuring, dissipate discharge with plans for outpatient follow-up with a back doctor if his symptoms persist and return precautions if symptoms were to worsen.  Patient agrees to this plan, dissipate reassessment after meds and imaging.  8:39 AM Patient reassessed and he is feeling somewhat better.  X-rays returned showing some chronic appearing deformities but no acute fracture or dislocation.  We again had a shared decision-making conversation discussing doing urinalysis or other labs and patient did not want this.  Will give a shot of Toradol and prescriptions for medications and he will follow-up with PCP and a back doctor.  He agrees with plan of care return precautions and was discharged in good condition feeling better.         Final Clinical Impression(s) / ED Diagnoses Final diagnoses:  Acute back pain, unspecified back location, unspecified back pain laterality  Muscle spasm of back    Rx / DC Orders ED Discharge Orders          Ordered    lidocaine (LIDODERM) 5 %  Every 24 hours        10/30/23 0842    oxyCODONE-acetaminophen (PERCOCET/ROXICET) 5-325 MG tablet  Every 4 hours PRN        10/30/23 0842    cyclobenzaprine (FLEXERIL) 10 MG tablet  2 times daily PRN        10/30/23 0842            Clinical Impression: 1. Acute back pain,  unspecified back location, unspecified back pain laterality   2.  Muscle spasm of back     Disposition: Discharge  Condition: Good  I have discussed the results, Dx and Tx plan with the pt(& family if present). He/she/they expressed understanding and agree(s) with the plan. Discharge instructions discussed at great length. Strict return precautions discussed and pt &/or family have verbalized understanding of the instructions. No further questions at time of discharge.    New Prescriptions   CYCLOBENZAPRINE (FLEXERIL) 10 MG TABLET    Take 1 tablet (10 mg total) by mouth 2 (two) times daily as needed for muscle spasms.   LIDOCAINE (LIDODERM) 5 %    Place 1 patch onto the skin daily. Remove & Discard patch within 12 hours or as directed by Jerome Lam   OXYCODONE-ACETAMINOPHEN (PERCOCET/ROXICET) 5-325 MG TABLET    Take 1 tablet by mouth every 4 (four) hours as needed for severe pain (pain score 7-10).    Follow Up: [place], Betty G, Jerome Lam [address] [place] [address] [phone]     [place], [facility] [address] [place] [address] [phone]         Jerome Lam, Jerome Brim, Jerome Lam 10/30/23 [id number]

## 2023-10-30 NOTE — ED Triage Notes (Signed)
 Pt presents via POV c/o mid lumbar back pain that started this am. Reports pain worse with movement or bending over.   Ambulatory to treatment room. A&O x4. Denies specific injury.

## 2024-03-27 ENCOUNTER — Encounter (HOSPITAL_BASED_OUTPATIENT_CLINIC_OR_DEPARTMENT_OTHER): Payer: Self-pay | Admitting: Emergency Medicine

## 2024-03-27 ENCOUNTER — Emergency Department (HOSPITAL_BASED_OUTPATIENT_CLINIC_OR_DEPARTMENT_OTHER)
Admission: EM | Admit: 2024-03-27 | Discharge: 2024-03-28 | Disposition: A | Discharge status: Home / Self Care | Attending: Emergency Medicine | Admitting: Emergency Medicine

## 2024-03-27 ENCOUNTER — Emergency Department (HOSPITAL_BASED_OUTPATIENT_CLINIC_OR_DEPARTMENT_OTHER): Admitting: Radiology

## 2024-03-27 DIAGNOSIS — S20212A Contusion of left front wall of thorax, initial encounter: Secondary | ICD-10-CM | POA: Insufficient documentation

## 2024-03-27 DIAGNOSIS — W19XXXA Unspecified fall, initial encounter: Secondary | ICD-10-CM | POA: Diagnosis not present

## 2024-03-27 DIAGNOSIS — R071 Chest pain on breathing: Secondary | ICD-10-CM | POA: Diagnosis not present

## 2024-03-27 DIAGNOSIS — J45909 Unspecified asthma, uncomplicated: Secondary | ICD-10-CM | POA: Diagnosis not present

## 2024-03-27 DIAGNOSIS — S299XXA Unspecified injury of thorax, initial encounter: Secondary | ICD-10-CM | POA: Diagnosis not present

## 2024-03-27 DIAGNOSIS — R059 Cough, unspecified: Secondary | ICD-10-CM | POA: Diagnosis not present

## 2024-03-27 DIAGNOSIS — R0781 Pleurodynia: Secondary | ICD-10-CM | POA: Diagnosis not present

## 2024-03-27 MED ORDER — METHOCARBAMOL 750 MG PO TABS: 750.0000 mg | ORAL_TABLET | Freq: Three times a day (TID) | ORAL | 0 refills | Status: AC

## 2024-03-27 MED ORDER — IBUPROFEN 600 MG PO TABS: 600.0000 mg | ORAL_TABLET | Freq: Three times a day (TID) | ORAL | 0 refills | Status: AC

## 2024-03-27 MED ORDER — IBUPROFEN 800 MG PO TABS
800.0000 mg | ORAL_TABLET | Freq: Once | ORAL | Status: AC
  Administered 2024-03-27: 800 mg via ORAL
  Filled 2024-03-27: qty 1

## 2024-03-27 MED ORDER — LIDOCAINE 5 % EX PTCH
1.0000 | MEDICATED_PATCH | CUTANEOUS | Status: DC
  Administered 2024-03-27: 1 via TRANSDERMAL
  Filled 2024-03-27: qty 1

## 2024-03-27 NOTE — ED Triage Notes (Signed)
 Pt states was playing and feels like may have broken a rib. Pain in front and rear left upper side. Painful to breath and cough.

## 2024-03-27 NOTE — Discharge Instructions (Addendum)
 Thank you for coming in today. You were evaluated for chest and back pain after a fall. Your imaging and exam were reassuring, and your symptoms are consistent with a muscle strain or rib contusion.  Instructions:  Take Ibuprofen  600 mg by mouth every 6 hours as needed with food for pain.  Take Robaxin  (Methocarbamol ) 750 mg by mouth every 8 hours as needed for muscle spasm.  Rest and avoid strenuous activity until pain improves.  Use warm compresses to the painful area for 15-20 minutes as needed.  Do deep breathing exercises several times an hour to prevent lung complications.  Return to the clinic if symptoms worsen or don't improve in 5-7 days.  Seek emergency care if you develop trouble breathing, chest pain worsening at rest, fever, or lightheadedness.  Please feel free to reach out if you have any questions or concerns. Wishing you a smooth and speedy recovery.
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
## 2024-03-28 NOTE — ED Notes (Signed)
 Pt given discharge instructions. Opportunities given for questions. Pt stable at time of discharge.

## 2024-06-05 ENCOUNTER — Emergency Department (HOSPITAL_BASED_OUTPATIENT_CLINIC_OR_DEPARTMENT_OTHER): Admitting: Radiology

## 2024-06-05 ENCOUNTER — Other Ambulatory Visit: Payer: Self-pay

## 2024-06-05 ENCOUNTER — Emergency Department (HOSPITAL_BASED_OUTPATIENT_CLINIC_OR_DEPARTMENT_OTHER)

## 2024-06-05 ENCOUNTER — Encounter (HOSPITAL_BASED_OUTPATIENT_CLINIC_OR_DEPARTMENT_OTHER): Payer: Self-pay

## 2024-06-05 ENCOUNTER — Emergency Department (HOSPITAL_BASED_OUTPATIENT_CLINIC_OR_DEPARTMENT_OTHER)
Admission: EM | Admit: 2024-06-05 | Discharge: 2024-06-05 | Disposition: A | Discharge status: Home / Self Care | Attending: Emergency Medicine | Admitting: Emergency Medicine

## 2024-06-05 DIAGNOSIS — M79641 Pain in right hand: Secondary | ICD-10-CM | POA: Diagnosis not present

## 2024-06-05 DIAGNOSIS — S0240DA Maxillary fracture, left side, initial encounter for closed fracture: Secondary | ICD-10-CM | POA: Diagnosis not present

## 2024-06-05 DIAGNOSIS — S0993XA Unspecified injury of face, initial encounter: Secondary | ICD-10-CM | POA: Diagnosis present

## 2024-06-05 DIAGNOSIS — S6991XA Unspecified injury of right wrist, hand and finger(s), initial encounter: Secondary | ICD-10-CM | POA: Diagnosis not present

## 2024-06-05 DIAGNOSIS — S0232XA Fracture of orbital floor, left side, initial encounter for closed fracture: Secondary | ICD-10-CM | POA: Insufficient documentation

## 2024-06-05 DIAGNOSIS — J45909 Unspecified asthma, uncomplicated: Secondary | ICD-10-CM | POA: Diagnosis not present

## 2024-06-05 DIAGNOSIS — S0285XA Fracture of orbit, unspecified, initial encounter for closed fracture: Secondary | ICD-10-CM

## 2024-06-05 DIAGNOSIS — S0231XA Fracture of orbital floor, right side, initial encounter for closed fracture: Secondary | ICD-10-CM | POA: Diagnosis not present

## 2024-06-05 DIAGNOSIS — R040 Epistaxis: Secondary | ICD-10-CM | POA: Diagnosis not present

## 2024-06-05 MED ORDER — TETRACAINE HCL 0.5 % OP SOLN
1.0000 [drp] | Freq: Once | OPHTHALMIC | Status: AC
  Administered 2024-06-05: 1 [drp] via OPHTHALMIC
  Filled 2024-06-05: qty 4

## 2024-06-05 MED ORDER — FLUORESCEIN SODIUM 1 MG OP STRP
1.0000 | ORAL_STRIP | Freq: Once | OPHTHALMIC | Status: AC
  Administered 2024-06-05: 1 via OPHTHALMIC
  Filled 2024-06-05: qty 1

## 2024-06-05 MED ORDER — AMOXICILLIN-POT CLAVULANATE 875-125 MG PO TABS: 1.0000 | ORAL_TABLET | Freq: Two times a day (BID) | ORAL | 0 refills | Status: AC

## 2024-06-05 MED ORDER — AMOXICILLIN-POT CLAVULANATE 875-125 MG PO TABS
1.0000 | ORAL_TABLET | Freq: Once | ORAL | Status: AC
  Administered 2024-06-05: 1 via ORAL
  Filled 2024-06-05: qty 1
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
## 2024-06-05 NOTE — ED Notes (Signed)
 DC paperwork given and verbally understood.

## 2024-06-05 NOTE — ED Notes (Signed)
 Patient states he is supposed to wear glasses,but never does.

## 2024-06-05 NOTE — Discharge Instructions (Signed)
 As discussed, please refrain from blowing your nose until cleared by the ophthalmologist.  You can use decongestant medications instead.  Please call ophthalmology office today for an appointment tomorrow.  Seek emergency care if experiencing any new or worsening symptoms.  Alternating between 650 mg Tylenol  and 400 mg Advil : The best way to alternate taking Acetaminophen  (example Tylenol ) and Ibuprofen  (example Advil /Motrin ) is to take them 3 hours apart. For example, if you take ibuprofen  at 6 am you can then take Tylenol  at 9 am. You can continue this regimen throughout the day, making sure you do not exceed the recommended maximum dose for each drug.

## 2024-07-31 ENCOUNTER — Ambulatory Visit
Admission: EM | Admit: 2024-07-31 | Discharge: 2024-07-31 | Disposition: A | Discharge status: Home / Self Care | Attending: Emergency Medicine | Admitting: Emergency Medicine

## 2024-07-31 ENCOUNTER — Emergency Department (HOSPITAL_COMMUNITY)
Admission: EM | Admit: 2024-07-31 | Discharge: 2024-07-31 | Disposition: A | Discharge status: Home / Self Care | Source: Ambulatory Visit | Attending: Emergency Medicine | Admitting: Emergency Medicine

## 2024-07-31 ENCOUNTER — Encounter (HOSPITAL_COMMUNITY): Payer: Self-pay

## 2024-07-31 ENCOUNTER — Ambulatory Visit: Payer: Self-pay

## 2024-07-31 ENCOUNTER — Other Ambulatory Visit: Payer: Self-pay

## 2024-07-31 ENCOUNTER — Telehealth: Payer: Self-pay

## 2024-07-31 DIAGNOSIS — R519 Headache, unspecified: Secondary | ICD-10-CM

## 2024-07-31 DIAGNOSIS — J111 Influenza due to unidentified influenza virus with other respiratory manifestations: Secondary | ICD-10-CM

## 2024-07-31 DIAGNOSIS — J101 Influenza due to other identified influenza virus with other respiratory manifestations: Secondary | ICD-10-CM | POA: Insufficient documentation

## 2024-07-31 DIAGNOSIS — F1290 Cannabis use, unspecified, uncomplicated: Secondary | ICD-10-CM | POA: Diagnosis not present

## 2024-07-31 LAB — POC COVID19/FLU A&B COMBO
Covid Antigen, POC: NEGATIVE
Influenza A Antigen, POC: POSITIVE — AB
Influenza B Antigen, POC: NEGATIVE

## 2024-07-31 MED ORDER — OSELTAMIVIR PHOSPHATE 75 MG PO CAPS: 75.0000 mg | ORAL_CAPSULE | Freq: Two times a day (BID) | ORAL | 0 refills | Status: AC

## 2024-07-31 MED ORDER — IBUPROFEN 800 MG PO TABS
800.0000 mg | ORAL_TABLET | Freq: Once | ORAL | Status: AC
  Administered 2024-07-31: 800 mg via ORAL
  Filled 2024-07-31: qty 1

## 2024-07-31 NOTE — ED Triage Notes (Signed)
 Pt was diagnosed with influenza A but they told him to come to the ER because he was having the worst headache of his life so he needed a CT scan and they told him not to take any ibuprofen  or anything.  Pt endorses chronic headaches but says this one is worse and his back and joints ache as well.

## 2024-07-31 NOTE — ED Triage Notes (Addendum)
 Patient c/o Spinal pain (aches), chest tightness, cough, headache that is so bad it makes me want to cry, chills, fever (101.48F) and body aches.  Home interventions: dayquil (0800)  Started: yesterday- today

## 2024-07-31 NOTE — Telephone Encounter (Signed)
 Spoke to the patients mother she stated the UC did indeed call him in tamiflu.   Copied from CRM [id number]. Topic: Clinical - Medical Advice >> Jul 31, 2024  1:23 PM Rea ORN wrote: Reason for CRM: Pt mom Kimberley called to request tamiflu for pt. He was seen at [facility] today and dx with Flu but wasn't given an rx. His sx began yesterday.  Please call [name] back to advise, [phone]

## 2024-07-31 NOTE — Telephone Encounter (Signed)
 FYI Only or Action Required?: FYI only for provider: ED advised.  Patient was last seen in primary care on 10/25/2023 by Jordan, Betty G, MD.  Called Nurse Triage reporting Headache.  Symptoms began yesterday.  Interventions attempted: OTC medications: Dayquil.  Symptoms are: gradually worsening.  Triage Disposition: Go to ED Now (Notify PCP)  Patient/caregiver understands and will follow disposition?: No Reason for Disposition  [1] SEVERE headache (e.g., excruciating) AND [2] worst headache of life  Answer Assessment - Initial Assessment Questions Patient's mom Jerome Lam calling today not on DPR, not with patient. Patient taking dayquil. Mom reports patient states he's in tears on phone stating its the worst head of his life and is in so much pain. Advised ED based on these claims. Patient's mom denied ED recommendation and states well I recommend him coming into the office to be seen and get tamiflu. Educated patient's mom of importance of being evaluated in ED due to severe headache. Mom states they have bad experiences at the ED and will not be going there. Stated she'll [facility].   1. LOCATION: Where does it hurt?      Mom states patient said whole head is hurting  2. ONSET: When did the headache start? (e.g., minutes, hours, days)      Yesterday  3. PATTERN: Does the pain come and go, or has it been constant since it started?     Constant  4. SEVERITY: How bad is the pain? and What does it keep you from doing?  (e.g., Scale 1-10; mild, moderate, or severe)     10/10, severe mom reports patient states it has him in tears  5. RECURRENT SYMPTOM: Have you ever had headaches before? If Yes, ask: When was the last time? and What happened that time?      Denies  6. CAUSE: What do you think is causing the headache?     Unsure, flu  7. MIGRAINE: Have you been diagnosed with migraine headaches? If Yes, ask: Is this headache  similar?      Mom denies  8. HEAD INJURY: Has there been any recent injury to your head?      Seen in ED 06/05/24 for multiple facial fractures and sinus hemorrhage 9. OTHER SYMPTOMS: Do you have any other symptoms? (e.g., fever, stiff neck, eye pain, sore throat, cold symptoms)     Coughing, horse voice , body aches  Protocols used: Headache-A-AH  Copied from CRM #[id number]. Topic: Clinical - Red Word Triage >> Jul 31, 2024 10:21 AM [name] wrote: Red Word that prompted transfer to Nurse Triage:  Jerome Lam is not on pt DPR list -body aches -does not know if he has fever,  -coughing  -severe headache  Asking for Tamiflu

## 2024-07-31 NOTE — Discharge Instructions (Addendum)
 Go to Er for evaluation of worst HA of life You have tested positive for Influenza A. Alternate tylenol /ibuprofen  as label directed. Take tamiflu as directed. Push fluids. Do not eat or drink anything until seen by Er provider

## 2024-07-31 NOTE — ED Provider Notes (Signed)
 " [place] EMERGENCY DEPARTMENT AT [facility] Provider Note   CSN: [id number] Arrival date & time: 07/31/24  1505     Patient presents with: Influenza   Jerome Lam is a 28 y.o. male.    Influenza 28 year old male presenting today with flulike symptoms.  Patient reports that he had a headache that started this morning.  He went to urgent care and was diagnosed with flu A.  While there he told them that he had the worst headache that he ever had urgent care then sent him to the ER to have a CT scan done.  He was not given any Tylenol  for the headache while at urgent care.  He reports that he stopped at [facility] to get Tylenol .  He took the Tylenol  at about 1 PM.  When he was seen by provider in the ER he reports that his headache has slowly diminished and is now only 7 out of 10.  He denies any blurred vision, difficulty walking, slurred speech, decrease in strength, lack of coordination.  He also denies any chest pain or increased shortness of breath.  He also has of some lower back pain that he reports feels like body aches.  He denies any worsening headache to light or sound.    Prior to Admission medications  Medication Sig Start Date End Date Taking? Authorizing Provider  albuterol  (VENTOLIN  HFA) 108 (90 Base) MCG/ACT inhaler Inhale 2 puffs into the lungs every 6 (six) hours as needed for wheezing or shortness of breath. 09/14/23   Jordan, Betty G, MD  amoxicillin -clavulanate (AUGMENTIN ) 875-125 MG tablet Take 1 tablet by mouth every 12 (twelve) hours. 06/05/24   Hoy Nidia JULIANNA, PA-C  budesonide-formoterol [facility]) 160-4.5 MCG/ACT inhaler Inhale 2 puffs into the lungs 2 (two) times daily as needed (shortness of breath).    [provider]  cyclobenzaprine  (FLEXERIL ) 10 MG tablet Take 1 tablet (10 mg total) by mouth 2 (two) times daily as needed for muscle spasms. 10/30/23   Tegeler, Lonni PARAS, MD  escitalopram  (LEXAPRO ) 20 MG tablet Take 1 tablet (20 mg  total) by mouth daily. 10/25/23   Jordan, Betty G, MD  levocetirizine (XYZAL) 5 MG tablet Take 5 mg by mouth every morning.    [provider]  lidocaine  (LIDODERM ) 5 % Place 1 patch onto the skin daily. Remove & Discard patch within 12 hours or as directed by MD 10/30/23   Tegeler, Lonni PARAS, MD  methocarbamol  (ROBAXIN ) 750 MG tablet Take 1 tablet (750 mg total) by mouth 3 (three) times daily. 03/27/24   Azadegan, Maryam, MD  montelukast (SINGULAIR) 10 MG tablet Take 10 mg by mouth every morning.     [provider]  oseltamivir (TAMIFLU) 75 MG capsule Take 1 capsule (75 mg total) by mouth every 12 (twelve) hours. 07/31/24   Defelice, Jeanette, NP  oxyCODONE -acetaminophen  (PERCOCET/ROXICET) 5-325 MG tablet Take 1 tablet by mouth every 4 (four) hours as needed for severe pain (pain score 7-10). 10/30/23   Tegeler, Lonni PARAS, MD  pantoprazole  (PROTONIX ) 40 MG tablet Take 1 tablet (40 mg total) by mouth daily. 10/25/23   Jordan, Betty G, MD    Allergies: Bee venom    Review of Systems  All other systems reviewed and are negative.   Updated Vital Signs BP 125/82 (BP Location: Right Arm)   Pulse 88   Temp 99.8 F (37.7 C) (Oral)   Resp 19   Wt 90.7 kg   SpO2 99%  BMI 32.28 kg/m   Physical Exam Vitals and nursing note reviewed.  HENT:     Mouth/Throat:     Pharynx: Oropharynx is clear.  Cardiovascular:     Rate and Rhythm: Normal rate.     Pulses: Normal pulses.  Pulmonary:     Effort: Pulmonary effort is normal.     Breath sounds: Normal breath sounds.  Abdominal:     General: Abdomen is flat. Bowel sounds are normal.     Palpations: Abdomen is soft.  Skin:    General: Skin is warm and dry.  Neurological:     General: No focal deficit present.     Mental Status: He is alert.     GCS: GCS eye subscore is 4. GCS verbal subscore is 5. GCS motor subscore is 6.     Cranial Nerves: Cranial nerves 2-12 are intact.     Sensory: Sensation is intact.      Motor: Motor function is intact. No weakness, tremor or pronator drift.     Coordination: Coordination is intact. Coordination normal. Finger-Nose-Finger Test and Heel to [facility] Test normal.     Gait: Gait is intact. Gait normal.     Comments: No nystagmus noted.  Patient was able to walk without any dizziness or unsteadiness.     (all labs ordered are listed, but only abnormal results are displayed) Labs Reviewed - No data to display  EKG: None  Radiology: No results found.   Procedures   Medications Ordered in the ED  ibuprofen  (ADVIL ) tablet 800 mg (has no administration in time range)                                    Medical Decision Making Risk Prescription drug management.   Impression: 28 year old old male presenting with headache and flulike symptoms.  Differential diagnosis include flu, COVID, RSV, arachnoid hemorrhage, viral URI, cluster headache, tension headache  Additional History: Patient was able to provide history.  I also reviewed other outpatient notes and the previous urgent care note.  Labs: None  Imaging: None  ED Course/Meds: 28 year old male presenting with headache and flulike symptoms.  While at urgent care patient was diagnosed with flu A.  His daughter also has flu A.  He was sent here because of the headache he was having.  I spoke to the patient on risk and benefits of having a head CT done.  After this long discussion he states that he does not want to have the head CT right now.  I told him if he changes his mind or starts to develop any blurred vision, nausea, difficulty speaking, or weakness he can return to the ER for imaging.  He verbally states that he understands the risk and benefits of the CT and not having it.  I told him to continue taking Tylenol  and ibuprofen  for the headache and fevers.  Told him when to return to the ER.  He remained stable while in the ER and at discharge.      Final diagnoses:  None    ED Discharge Orders      None          Rosaline Almarie MATSU, [place] 07/31/24 1622  "

## 2024-07-31 NOTE — ED Provider Notes (Signed)
 [facility]    CSN: [id number] Arrival date & time: 07/31/24  1218      History   Chief Complaint Chief Complaint  Patient presents with   Chills   Cough   Generalized Body Aches   Chest Pain   Headache   Fever    HPI Jerome Lam is a 28 y.o. male.   28 year old male pt, Jerome Lam, presents to urgent care for evaluation of severe 11/10spinal pain, reports severe HAworst of life temple area and back of head, also reports chest tightness, cough.  Took Dayquil at 0800, also took meds yesterday(tylenol  and ibuprofen ) for symptoms. Pt smells strongly of marijuana and endorse use.    The history is provided by the patient. No language interpreter was used.  Cough Associated symptoms: chills, fever, headaches and myalgias   Chest Pain Associated symptoms: back pain, cough, fever and headache   Associated symptoms: no abdominal pain, no nausea and no vomiting   Headache Associated symptoms: back pain, cough, fever, myalgias and photophobia   Associated symptoms: no abdominal pain, no nausea and no vomiting   Fever Associated symptoms: chills, cough, headaches and myalgias   Associated symptoms: no nausea and no vomiting     Past Medical History:  Diagnosis Date   Asthma    Attention deficit hyperactivity disorder (ADHD)    Depression    Vomiting    Since December    Patient Active Problem List   Diagnosis Date Noted   Worst headache of life 07/31/2024   Influenza-like illness 07/31/2024   Marijuana smoker 07/31/2024   Influenza A 07/31/2024   Asthma, intermittent, uncomplicated 09/14/2023   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   GERD (gastroesophageal reflux disease) 11/08/2012   Constipation 10/03/2012   Abdominal pain, other specified site 10/02/2012   Vomiting     Past Surgical History:  Procedure Laterality Date   KNEE ARTHROSCOPY WITH ANTERIOR CRUCIATE LIGAMENT (ACL) REPAIR WITH HAMSTRING GRAFT Right 05/24/2022    Procedure: KNEE ARTHROSCOPY WITH ANTERIOR CRUCIATE LIGAMENT (ACL) RECONSTRUCTION WITH HAMSTRING GRAFT, PARTIAL LATERAL MENISCECTOMY;  Surgeon: Sharl Selinda Dover, MD;  Location: [facility];  Service: Orthopedics;  Laterality: Right;  120   MENISCUS REPAIR Right 05/24/2022   Procedure: REPAIR OF MEDIAL MENISCUS;  Surgeon: Sharl Selinda Dover, MD;  Location: [facility];  Service: Orthopedics;  Laterality: Right;  120   NO PAST SURGERIES         Home Medications    Prior to Admission medications  Medication Sig Start Date End Date Taking? Authorizing Provider  oseltamivir (TAMIFLU) 75 MG capsule Take 1 capsule (75 mg total) by mouth every 12 (twelve) hours. 07/31/24  Yes Atiya Yera, Rilla, NP  albuterol  (VENTOLIN  HFA) 108 (90 Base) MCG/ACT inhaler Inhale 2 puffs into the lungs every 6 (six) hours as needed for wheezing or shortness of breath. 09/14/23   Jordan, Betty G, MD  amoxicillin -clavulanate (AUGMENTIN ) 875-125 MG tablet Take 1 tablet by mouth every 12 (twelve) hours. 06/05/24   Hoy Nidia JULIANNA, PA-C  budesonide-formoterol [facility]) 160-4.5 MCG/ACT inhaler Inhale 2 puffs into the lungs 2 (two) times daily as needed (shortness of breath).    [provider]  cyclobenzaprine  (FLEXERIL ) 10 MG tablet Take 1 tablet (10 mg total) by mouth 2 (two) times daily as needed for muscle spasms. 10/30/23   Tegeler, Lonni PARAS, MD  escitalopram  (LEXAPRO ) 20 MG tablet Take 1 tablet (20 mg total) by mouth daily. 10/25/23   Jordan, Betty G, MD  levocetirizine (XYZAL) 5 MG tablet Take 5 mg by mouth every morning.    [provider]  lidocaine  (LIDODERM ) 5 % Place 1 patch onto the skin daily. Remove & Discard patch within 12 hours or as directed by MD 10/30/23   Tegeler, Lonni PARAS, MD  methocarbamol  (ROBAXIN ) 750 MG tablet Take 1 tablet (750 mg total) by mouth 3 (three) times daily. 03/27/24   Azadegan, Maryam, MD  montelukast (SINGULAIR) 10 MG tablet Take 10 mg by mouth every morning.      [provider]  oxyCODONE -acetaminophen  (PERCOCET/ROXICET) 5-325 MG tablet Take 1 tablet by mouth every 4 (four) hours as needed for severe pain (pain score 7-10). 10/30/23   Tegeler, Lonni PARAS, MD  pantoprazole  (PROTONIX ) 40 MG tablet Take 1 tablet (40 mg total) by mouth daily. 10/25/23   Jordan, Betty G, MD    Family History Family History  Problem Relation Age of Onset   Diabetes Mother    Breast cancer Mother    Stroke Mother    Hypertension Father    Hyperlipidemia Father    Chronic Renal Failure Father    Celiac disease Neg Hx    Ulcers Neg Hx    Cholelithiasis Neg Hx     Social History Social History[1]   Allergies   Bee venom   Review of Systems Review of Systems  Constitutional:  Positive for chills and fever.  Eyes:  Positive for photophobia.  Respiratory:  Positive for cough and chest tightness.   Gastrointestinal:  Negative for abdominal pain, nausea and vomiting.  Musculoskeletal:  Positive for back pain and myalgias.  Neurological:  Positive for headaches.  All other systems reviewed and are negative.    Physical Exam Triage Vital Signs ED Triage Vitals [07/31/24 1232]  Encounter Vitals Group     BP 136/79     Girls Systolic BP Percentile      Girls Diastolic BP Percentile      Boys Systolic BP Percentile      Boys Diastolic BP Percentile      Pulse Rate 88     Resp 20     Temp (!) 100.5 F (38.1 C)     Temp Source Oral     SpO2 96 %     Weight      Height      Head Circumference      Peak Flow      Pain Score      Pain Loc      Pain Education      Exclude from Growth Chart    No data found.  Updated Vital Signs BP 136/79 (BP Location: Right Arm)   Pulse 88   Temp (!) 100.5 F (38.1 C) (Oral)   Resp 20   SpO2 96%   Visual Acuity Right Eye Distance:   Left Eye Distance:   Bilateral Distance:    Right Eye Near:   Left Eye Near:    Bilateral Near:     Physical Exam Vitals and nursing note reviewed.   Constitutional:      General: He is in acute distress.     Appearance: He is well-developed. He is ill-appearing.  HENT:     Head: Normocephalic.     Right Ear: Tympanic membrane is retracted.     Left Ear: Tympanic membrane is retracted.     Nose: Mucosal edema and congestion present.     Mouth/Throat:     Lips: Pink.     Mouth:  Mucous membranes are moist.     Pharynx: Oropharynx is clear. Uvula midline.  Eyes:     General: Lids are normal.     Extraocular Movements: Extraocular movements intact.     Conjunctiva/sclera:     Right eye: Right conjunctiva is injected.     Left eye: Left conjunctiva is injected.     Pupils: Pupils are equal, round, and reactive to light.     Comments: Pinpoint bilaterally  Cardiovascular:     Rate and Rhythm: Normal rate and regular rhythm.     Heart sounds: Normal heart sounds.  Pulmonary:     Effort: Pulmonary effort is normal. No respiratory distress.     Breath sounds: Normal breath sounds and air entry. No decreased breath sounds or wheezing.  Abdominal:     General: There is no distension.     Palpations: Abdomen is soft.  Musculoskeletal:        General: Normal range of motion.     Cervical back: Normal range of motion.  Skin:    General: Skin is warm and dry.     Findings: No rash.  Neurological:     General: No focal deficit present.     Mental Status: He is alert and oriented to person, place, and time.     GCS: GCS eye subscore is 4. GCS verbal subscore is 5. GCS motor subscore is 6.     Cranial Nerves: No cranial nerve deficit.     Sensory: No sensory deficit.  Psychiatric:        Speech: Speech normal.        Behavior: Behavior normal.      UC Treatments / Results  Labs (all labs ordered are listed, but only abnormal results are displayed) Labs Reviewed  POC COVID19/FLU A&B COMBO - Abnormal; Notable for the following components:      Result Value   Influenza A Antigen, POC Positive (*)    All other components within  normal limits    EKG   Radiology No results found.  Procedures Procedures (including critical care time)  Medications Ordered in UC Medications - No data to display  Initial Impression / Assessment and Plan / UC Course  I have reviewed the triage vital signs and the nursing notes.  Pertinent labs & imaging results that were available during my care of the patient were reviewed by me and considered in my medical decision making (see chart for details).    Discussed exam findings and plan of care with patient and spouse, patient had previously contacted nurse triage line PTA and was advised to go to the ER for further evaluation of worst headache of life , patient is extremely uncomfortable in exam room, with photophobia, worst headache of life, severe spine pain, patient appears to be in acute distress, will treat for influenza A with Tamiflu, however given symptoms recommend patient go to the emergency room for further evaluation/CT scan/labs,etc.  Patient verbalized understanding to this provider.  Ddx: Worst HA of life, Influenza A, migraine HA, meningitis, viral illness, marijuana use Final Clinical Impressions(s) / UC Diagnoses   Final diagnoses:  Influenza-like illness  Worst headache of life  Marijuana smoker  Influenza A     Discharge Instructions      Go to Er for evaluation of worst HA of life You have tested positive for Influenza A. Alternate tylenol /ibuprofen  as label directed. Take tamiflu as directed. Push fluids. Do not eat or drink anything until seen by Er  provider    ED Prescriptions     Medication Sig Dispense Auth. Provider   oseltamivir (TAMIFLU) 75 MG capsule Take 1 capsule (75 mg total) by mouth every 12 (twelve) hours. 10 capsule Normagene Harvie, NP      PDMP not reviewed this encounter.     [1]  Social History Tobacco Use   Smoking status: Never   Smokeless tobacco: Never  Vaping Use   Vaping status: Never Used  Substance Use  Topics   Alcohol use: Not Currently    Comment: occasionally   Drug use: Yes    Frequency: 2.0 times per week    Types: Marijuana     Mikia Delaluz, Rilla, NP 07/31/24 1825  "

## 2024-07-31 NOTE — Discharge Instructions (Addendum)
 Continue to alternate between Tylenol  and ibuprofen  for fever and headache.  Continue to drink lots of fluid and rest.  If you start developing worsening headaches, blurred vision, nausea, difficulty walking please return to the ER.  Follow up with your primary care if the symptoms continue.
